# Patient Record
Sex: Male | Born: 1955 | Race: White | Hispanic: No | Marital: Single | Smoking: Never smoker
Health system: Southern US, Community
[De-identification: ages and names within clinical notes are randomized; demographics above are authoritative.]

## PROBLEM LIST (undated history)

## (undated) DIAGNOSIS — I513 Intracardiac thrombosis, not elsewhere classified: Secondary | ICD-10-CM

## (undated) DIAGNOSIS — B20 Human immunodeficiency virus [HIV] disease: Secondary | ICD-10-CM

## (undated) DIAGNOSIS — D0372 Melanoma in situ of left lower limb, including hip: Secondary | ICD-10-CM

## (undated) DIAGNOSIS — I5022 Chronic systolic (congestive) heart failure: Secondary | ICD-10-CM

---

## 2013-07-08 HISTORY — PX: VENTRICULOPERITONEAL SHUNT: SHX204

## 2014-01-19 ENCOUNTER — Ambulatory Visit: Payer: Self-pay

## 2014-02-28 ENCOUNTER — Ambulatory Visit: Payer: Self-pay

## 2017-03-03 DIAGNOSIS — I240 Acute coronary thrombosis not resulting in myocardial infarction: Secondary | ICD-10-CM

## 2017-03-03 DIAGNOSIS — I5022 Chronic systolic (congestive) heart failure: Secondary | ICD-10-CM

## 2017-03-03 HISTORY — DX: Acute coronary thrombosis not resulting in myocardial infarction: I24.0

## 2017-03-03 HISTORY — DX: Chronic systolic (congestive) heart failure: I50.22

## 2017-08-08 DIAGNOSIS — D0372 Melanoma in situ of left lower limb, including hip: Secondary | ICD-10-CM

## 2017-08-08 HISTORY — DX: Melanoma in situ of left lower limb, including hip: D03.72

## 2018-04-06 ENCOUNTER — Inpatient Hospital Stay: Payer: Medicare HMO

## 2018-04-06 ENCOUNTER — Inpatient Hospital Stay
Admission: EM | Admit: 2018-04-06 | Discharge: 2018-05-08 | DRG: 974 | Disposition: E | Payer: Medicare HMO | Attending: Internal Medicine | Admitting: Internal Medicine

## 2018-04-06 ENCOUNTER — Emergency Department: Payer: Medicare HMO

## 2018-04-06 ENCOUNTER — Encounter: Payer: Self-pay | Admitting: *Deleted

## 2018-04-06 DIAGNOSIS — I472 Ventricular tachycardia: Secondary | ICD-10-CM | POA: Diagnosis present

## 2018-04-06 DIAGNOSIS — B20 Human immunodeficiency virus [HIV] disease: Secondary | ICD-10-CM | POA: Diagnosis present

## 2018-04-06 DIAGNOSIS — E872 Acidosis: Secondary | ICD-10-CM | POA: Diagnosis present

## 2018-04-06 DIAGNOSIS — I429 Cardiomyopathy, unspecified: Secondary | ICD-10-CM | POA: Diagnosis present

## 2018-04-06 DIAGNOSIS — G253 Myoclonus: Secondary | ICD-10-CM | POA: Diagnosis present

## 2018-04-06 DIAGNOSIS — R6520 Severe sepsis without septic shock: Secondary | ICD-10-CM | POA: Diagnosis present

## 2018-04-06 DIAGNOSIS — B9561 Methicillin susceptible Staphylococcus aureus infection as the cause of diseases classified elsewhere: Secondary | ICD-10-CM | POA: Diagnosis present

## 2018-04-06 DIAGNOSIS — Z66 Do not resuscitate: Secondary | ICD-10-CM | POA: Diagnosis not present

## 2018-04-06 DIAGNOSIS — R570 Cardiogenic shock: Secondary | ICD-10-CM | POA: Diagnosis present

## 2018-04-06 DIAGNOSIS — E440 Moderate protein-calorie malnutrition: Secondary | ICD-10-CM

## 2018-04-06 DIAGNOSIS — Z6825 Body mass index (BMI) 25.0-25.9, adult: Secondary | ICD-10-CM | POA: Diagnosis not present

## 2018-04-06 DIAGNOSIS — F329 Major depressive disorder, single episode, unspecified: Secondary | ICD-10-CM | POA: Diagnosis present

## 2018-04-06 DIAGNOSIS — J690 Pneumonitis due to inhalation of food and vomit: Secondary | ICD-10-CM | POA: Diagnosis not present

## 2018-04-06 DIAGNOSIS — I272 Pulmonary hypertension, unspecified: Secondary | ICD-10-CM | POA: Diagnosis present

## 2018-04-06 DIAGNOSIS — J9602 Acute respiratory failure with hypercapnia: Secondary | ICD-10-CM | POA: Diagnosis present

## 2018-04-06 DIAGNOSIS — E875 Hyperkalemia: Secondary | ICD-10-CM | POA: Diagnosis not present

## 2018-04-06 DIAGNOSIS — J189 Pneumonia, unspecified organism: Secondary | ICD-10-CM

## 2018-04-06 DIAGNOSIS — A419 Sepsis, unspecified organism: Principal | ICD-10-CM | POA: Diagnosis present

## 2018-04-06 DIAGNOSIS — I469 Cardiac arrest, cause unspecified: Secondary | ICD-10-CM | POA: Diagnosis present

## 2018-04-06 DIAGNOSIS — J9811 Atelectasis: Secondary | ICD-10-CM | POA: Diagnosis not present

## 2018-04-06 DIAGNOSIS — I447 Left bundle-branch block, unspecified: Secondary | ICD-10-CM | POA: Diagnosis present

## 2018-04-06 DIAGNOSIS — R34 Anuria and oliguria: Secondary | ICD-10-CM | POA: Diagnosis present

## 2018-04-06 DIAGNOSIS — Z01818 Encounter for other preprocedural examination: Secondary | ICD-10-CM

## 2018-04-06 DIAGNOSIS — Z79899 Other long term (current) drug therapy: Secondary | ICD-10-CM

## 2018-04-06 DIAGNOSIS — N170 Acute kidney failure with tubular necrosis: Secondary | ICD-10-CM | POA: Diagnosis present

## 2018-04-06 DIAGNOSIS — R680 Hypothermia, not associated with low environmental temperature: Secondary | ICD-10-CM | POA: Diagnosis not present

## 2018-04-06 DIAGNOSIS — A4901 Methicillin susceptible Staphylococcus aureus infection, unspecified site: Secondary | ICD-10-CM | POA: Diagnosis present

## 2018-04-06 DIAGNOSIS — K7589 Other specified inflammatory liver diseases: Secondary | ICD-10-CM | POA: Diagnosis present

## 2018-04-06 DIAGNOSIS — K7200 Acute and subacute hepatic failure without coma: Secondary | ICD-10-CM | POA: Diagnosis present

## 2018-04-06 DIAGNOSIS — R569 Unspecified convulsions: Secondary | ICD-10-CM | POA: Diagnosis present

## 2018-04-06 DIAGNOSIS — I509 Heart failure, unspecified: Secondary | ICD-10-CM

## 2018-04-06 DIAGNOSIS — Z85820 Personal history of malignant melanoma of skin: Secondary | ICD-10-CM

## 2018-04-06 DIAGNOSIS — Z515 Encounter for palliative care: Secondary | ICD-10-CM | POA: Diagnosis not present

## 2018-04-06 DIAGNOSIS — I5022 Chronic systolic (congestive) heart failure: Secondary | ICD-10-CM | POA: Diagnosis present

## 2018-04-06 DIAGNOSIS — Z7901 Long term (current) use of anticoagulants: Secondary | ICD-10-CM

## 2018-04-06 DIAGNOSIS — F419 Anxiety disorder, unspecified: Secondary | ICD-10-CM | POA: Diagnosis present

## 2018-04-06 DIAGNOSIS — G931 Anoxic brain damage, not elsewhere classified: Secondary | ICD-10-CM | POA: Diagnosis present

## 2018-04-06 DIAGNOSIS — Z8661 Personal history of infections of the central nervous system: Secondary | ICD-10-CM

## 2018-04-06 DIAGNOSIS — D696 Thrombocytopenia, unspecified: Secondary | ICD-10-CM | POA: Diagnosis present

## 2018-04-06 DIAGNOSIS — I214 Non-ST elevation (NSTEMI) myocardial infarction: Secondary | ICD-10-CM | POA: Diagnosis present

## 2018-04-06 DIAGNOSIS — Z982 Presence of cerebrospinal fluid drainage device: Secondary | ICD-10-CM

## 2018-04-06 DIAGNOSIS — Z4659 Encounter for fitting and adjustment of other gastrointestinal appliance and device: Secondary | ICD-10-CM

## 2018-04-06 DIAGNOSIS — I513 Intracardiac thrombosis, not elsewhere classified: Secondary | ICD-10-CM | POA: Diagnosis present

## 2018-04-06 DIAGNOSIS — J9601 Acute respiratory failure with hypoxia: Secondary | ICD-10-CM | POA: Diagnosis present

## 2018-04-06 DIAGNOSIS — D649 Anemia, unspecified: Secondary | ICD-10-CM | POA: Diagnosis not present

## 2018-04-06 HISTORY — DX: Intracardiac thrombosis, not elsewhere classified: I51.3

## 2018-04-06 HISTORY — DX: Human immunodeficiency virus (HIV) disease: B20

## 2018-04-06 HISTORY — DX: Melanoma in situ of left lower limb, including hip: D03.72

## 2018-04-06 HISTORY — DX: Chronic systolic (congestive) heart failure: I50.22

## 2018-04-06 LAB — COMPREHENSIVE METABOLIC PANEL
ALBUMIN: 3.7 g/dL (ref 3.5–5.0)
ALK PHOS: 81 U/L (ref 38–126)
ALT: 944 U/L — ABNORMAL HIGH (ref 17–63)
ANION GAP: 24 — AB (ref 5–15)
AST: 972 U/L — ABNORMAL HIGH (ref 15–41)
BILIRUBIN TOTAL: 1.4 mg/dL — AB (ref 0.3–1.2)
BUN: 29 mg/dL — ABNORMAL HIGH (ref 6–20)
CO2: 15 mmol/L — ABNORMAL LOW (ref 22–32)
Calcium: 9 mg/dL (ref 8.9–10.3)
Chloride: 105 mmol/L (ref 101–111)
Creatinine, Ser: 2.71 mg/dL — ABNORMAL HIGH (ref 0.61–1.24)
GFR calc Af Amer: 28 mL/min — ABNORMAL LOW (ref >60)
GFR calc non Af Amer: 24 mL/min — ABNORMAL LOW (ref >60)
GLUCOSE: 89 mg/dL (ref 65–99)
Potassium: 5 mmol/L (ref 3.5–5.1)
Sodium: 144 mmol/L (ref 135–145)
Total Protein: 6.2 g/dL — ABNORMAL LOW (ref 6.5–8.1)

## 2018-04-06 LAB — APTT: aPTT: 39 seconds — ABNORMAL HIGH (ref 24–36)

## 2018-04-06 LAB — CBC WITH DIFFERENTIAL/PLATELET
Basophils Absolute: 0 10*3/uL (ref 0–0.1)
Basophils Relative: 0 %
EOS ABS: 0 10*3/uL (ref 0–0.7)
EOS PCT: 0 %
HCT: 44.1 % (ref 40.0–52.0)
Hemoglobin: 14.5 g/dL (ref 13.0–18.0)
LYMPHS ABS: 0.9 10*3/uL — AB (ref 1.0–3.6)
Lymphocytes Relative: 6 %
MCH: 35.2 pg — AB (ref 26.0–34.0)
MCHC: 33 g/dL (ref 32.0–36.0)
MCV: 106.7 fL — ABNORMAL HIGH (ref 80.0–100.0)
MONO ABS: 0.9 10*3/uL (ref 0.2–1.0)
Monocytes Relative: 6 %
Neutro Abs: 13.6 10*3/uL — ABNORMAL HIGH (ref 1.4–6.5)
Neutrophils Relative %: 88 %
Platelets: 143 10*3/uL — ABNORMAL LOW (ref 150–440)
RBC: 4.13 MIL/uL — ABNORMAL LOW (ref 4.40–5.90)
RDW: 13.4 % (ref 11.5–14.5)
WBC: 15.4 10*3/uL — ABNORMAL HIGH (ref 3.8–10.6)

## 2018-04-06 LAB — LIPID PANEL
Cholesterol: 199 mg/dL (ref 0–200)
HDL: 68 mg/dL (ref >40)
LDL CALC: 116 mg/dL — AB (ref 0–99)
TRIGLYCERIDES: 75 mg/dL (ref <150)
Total CHOL/HDL Ratio: 2.9 RATIO
VLDL: 15 mg/dL (ref 0–40)

## 2018-04-06 LAB — PROTIME-INR
INR: 1.79
PROTHROMBIN TIME: 20.6 s — AB (ref 11.4–15.2)

## 2018-04-06 LAB — TROPONIN I: Troponin I: 17.6 ng/mL (ref <0.03)

## 2018-04-06 MED ORDER — ETOMIDATE 2 MG/ML IV SOLN
INTRAVENOUS | Status: AC | PRN
  Administered 2018-04-06: 15 mg via INTRAVENOUS

## 2018-04-06 MED ORDER — CHLORHEXIDINE GLUCONATE 0.12% ORAL RINSE (MEDLINE KIT)
15.0000 mL | Freq: Two times a day (BID) | OROMUCOSAL | Status: DC
  Administered 2018-04-07 – 2018-04-12 (×12): 15 mL via OROMUCOSAL

## 2018-04-06 MED ORDER — ACETAMINOPHEN 650 MG RE SUPP: 650.0000 mg | Freq: Four times a day (QID) | RECTAL | Status: DC | PRN

## 2018-04-06 MED ORDER — SODIUM CHLORIDE 0.9 % IV SOLN
INTRAVENOUS | Status: DC
  Administered 2018-04-07: via INTRAVENOUS

## 2018-04-06 MED ORDER — PROPOFOL 1000 MG/100ML IV EMUL
5.0000 ug/kg/min | Freq: Once | INTRAVENOUS | Status: AC
  Administered 2018-04-06: 10 ug/kg/min via INTRAVENOUS

## 2018-04-06 MED ORDER — DOPAMINE-DEXTROSE 3.2-5 MG/ML-% IV SOLN
2.0000 ug/kg/min | Freq: Once | INTRAVENOUS | Status: AC
  Administered 2018-04-06: 10 ug/kg/min via INTRAVENOUS

## 2018-04-06 MED ORDER — HEPARIN SODIUM (PORCINE) 5000 UNIT/ML IJ SOLN
60.0000 [IU]/kg | Freq: Once | INTRAMUSCULAR | Status: AC
  Administered 2018-04-06: 3600 [IU] via INTRAVENOUS
  Filled 2018-04-06: qty 1

## 2018-04-06 MED ORDER — ASPIRIN 81 MG PO CHEW: 324.0000 mg | CHEWABLE_TABLET | Freq: Once | ORAL | Status: DC

## 2018-04-06 MED ORDER — PROPOFOL 1000 MG/100ML IV EMUL
INTRAVENOUS | Status: AC
  Filled 2018-04-06: qty 100

## 2018-04-06 MED ORDER — DOCUSATE SODIUM 100 MG PO CAPS
100.0000 mg | ORAL_CAPSULE | Freq: Two times a day (BID) | ORAL | Status: DC
  Filled 2018-04-06: qty 1

## 2018-04-06 MED ORDER — FAMOTIDINE IN NACL 20-0.9 MG/50ML-% IV SOLN
20.0000 mg | INTRAVENOUS | Status: DC
  Administered 2018-04-07 – 2018-04-09 (×3): 20 mg via INTRAVENOUS
  Filled 2018-04-06 (×3): qty 50

## 2018-04-06 MED ORDER — FENTANYL CITRATE (PF) 100 MCG/2ML IJ SOLN
50.0000 ug | Freq: Once | INTRAMUSCULAR | Status: AC
  Administered 2018-04-06: 50 ug via INTRAVENOUS
  Filled 2018-04-06: qty 2

## 2018-04-06 MED ORDER — NOREPINEPHRINE 4 MG/250ML-% IV SOLN
0.0000 ug/min | Freq: Once | INTRAVENOUS | Status: AC
  Administered 2018-04-06: 10 ug/min via INTRAVENOUS

## 2018-04-06 MED ORDER — NOREPINEPHRINE 4 MG/250ML-% IV SOLN
INTRAVENOUS | Status: AC
  Filled 2018-04-06: qty 250

## 2018-04-06 MED ORDER — ONDANSETRON HCL 4 MG/2ML IJ SOLN: 4.0000 mg | Freq: Four times a day (QID) | INTRAMUSCULAR | Status: DC | PRN

## 2018-04-06 MED ORDER — ORAL CARE MOUTH RINSE
15.0000 mL | OROMUCOSAL | Status: DC
  Administered 2018-04-07 – 2018-04-12 (×55): 15 mL via OROMUCOSAL

## 2018-04-06 MED ORDER — SUCCINYLCHOLINE CHLORIDE 20 MG/ML IJ SOLN
INTRAMUSCULAR | Status: AC | PRN
  Administered 2018-04-06: 100 mg via INTRAVENOUS

## 2018-04-06 MED ORDER — ONDANSETRON HCL 4 MG PO TABS: 4.0000 mg | ORAL_TABLET | Freq: Four times a day (QID) | ORAL | Status: DC | PRN

## 2018-04-06 MED ORDER — DOPAMINE-DEXTROSE 3.2-5 MG/ML-% IV SOLN
INTRAVENOUS | Status: AC
  Filled 2018-04-06: qty 250

## 2018-04-06 MED ORDER — ACETAMINOPHEN 325 MG PO TABS: 650.0000 mg | ORAL_TABLET | Freq: Four times a day (QID) | ORAL | Status: DC | PRN

## 2018-04-06 MED ORDER — HYDROCODONE-ACETAMINOPHEN 5-325 MG PO TABS: 1.0000 | ORAL_TABLET | ORAL | Status: DC | PRN

## 2018-04-06 MED ORDER — SODIUM CHLORIDE 0.9 % IV SOLN
INTRAVENOUS | Status: DC
  Administered 2018-04-06: 22:00:00 via INTRAVENOUS

## 2018-04-06 MED ORDER — SODIUM CHLORIDE 0.9 % IV BOLUS
1000.0000 mL | Freq: Once | INTRAVENOUS | Status: AC
  Administered 2018-04-06: 1000 mL via INTRAVENOUS

## 2018-04-06 MED ORDER — BISACODYL 5 MG PO TBEC: 5.0000 mg | DELAYED_RELEASE_TABLET | Freq: Every day | ORAL | Status: DC | PRN

## 2018-04-06 MED ORDER — HEPARIN SODIUM (PORCINE) 5000 UNIT/ML IJ SOLN: 5000.0000 [IU] | Freq: Three times a day (TID) | INTRAMUSCULAR | Status: DC

## 2018-04-06 NOTE — Progress Notes (Signed)
eLink Physician-Brief Progress Note Patient Name: Brian Boyle DOB: 10-Jul-1956 MRN: [id number]   Date of Service  04/01/2018  HPI/Events of Note  62 yo male with PMH of HIV and Cardiomyopathy. Went to gym c/o feeling light headed and weak. Found unresponsive. On EMS arrival --> no pulse in VT. Prolonged code estimated at 40 minutes. Now intubated and ventilated post ROSC. PCCM asked to admit. VSS. Head CT Scan pending. If Head CT without evidence of bleed, will plan on therapeutic hypothermia.    eICU Interventions  No new orders.      Intervention Category Evaluation Type: New Patient Evaluation  [name] 04/02/2018, 11:47 PM

## 2018-04-06 NOTE — ED Provider Notes (Signed)
[facility] Emergency Department Provider Note  Time seen: 9:50 PM  I have reviewed the triage vital signs and the nursing notes.   HISTORY  Chief Complaint Cardiac Arrest    HPI Brian Boyle is a 62 y.o. male with only known past medical history of being HIV who presents to the emergency department status post cardiac arrest.  According the EMS they were called out to the scene with an unresponsive patient found the patient to be in cardiac arrest initially with a rhythm that appeared to be ventricular tachycardia versus torsades.  Per EMS patient was given 2 g of magnesium, was given 2-3 rounds of epinephrine, bicarbonate and calcium, Narcan, was shocked multiple times, I believe 11 shocks were delivered prior to ER arrival.  They were able to regain a pulse initially very hypotensive however upon arrival and they state the last blood pressure they got was 100 systolic.  Patient currently 73/51.  Patient presents intubated, with spontaneous pulse.   Past Medical History:  Diagnosis Date  . HIV (human immunodeficiency virus infection) (HCC)     There are no active problems to display for this patient.   History reviewed. No pertinent surgical history.  Prior to Admission medications   Not on File    No Known Allergies  No family history on file.  Social History Social History   Tobacco Use  . Smoking status: Not on file  Substance Use Topics  . Alcohol use: Not on file  . Drug use: Not on file    Review of Systems Unable to obtain a review of systems secondary to cardiac arrest  ____________________________________________   PHYSICAL EXAM:  VITAL SIGNS: ED Triage Vitals  Enc Vitals Group     BP 04-22-18 2131 (!) 140/115     Pulse Rate Apr 22, 2018 2131 75     Resp Apr 22, 2018 2131 20     Temp --      Temp src --      SpO2 2018-04-22 2125 99 %     Weight 04-22-18 2133 132 lb 4.4 oz (60 kg)     Height --      Head Circumference --    Peak Flow --      Pain Score 2018-04-22 2132 0     Pain Loc --      Pain Edu? --      Excl. in GC? --     Constitutional: Unresponsive occasional twitching/movement of extremities Eyes: 3 to 4 mm, sluggishly reactive bilaterally. ENT   Head: Normocephalic and atraumatic.   Mouth/Throat: Blood within the oral cavity.  ET tube present. Cardiovascular: Regular rhythm, rate around 80 bpm.  No obvious murmur Respiratory: Patient intubated, appears to have breath sounds bilaterally although significantly diminished with minimal chest rise.  No obvious sounds over epigastrium.  Significant abrasions to chest consistent with CPR/Lucas device. Gastrointestinal: Soft thin, no trauma present Musculoskeletal: Thin extremities.  No obvious trauma. Neurologic: Unresponsive.  No response to painful stimuli.  Occasionally moves extremities Skin:  Skin is warm, dry Psychiatric: Unresponsive  ____________________________________________    EKG  EKG reviewed and interpreted by myself shows sinus rhythm at 65 bpm with a widened QRS, normal axis, prolonged QTC patient has morphology consistent with left bundle branch block no old EKG for comparison available.  ____________________________________________    RADIOLOGY  ET tube 6 cm above carina.  Esophageal tube overlying distal esophagus recommend further advancement.  Right IJ central catheter tip above SVC.  No pneumothorax.  ____________________________________________   INITIAL IMPRESSION / ASSESSMENT AND PLAN / ED COURSE  Pertinent labs & imaging results that were available during my care of the patient were reviewed by me and considered in my medical decision making (see chart for details).  She presents to the emergency department status post cardiac arrest with spontaneous pulse intubated.  Diminished breath sounds with minimal chest rise.  I used a glide scope to evaluate ET tube placement, appear to be consistent with ET tube  dislodgment.  ET tube was pulled and the patient was bagged with a bag-valve-mask.  Jaw was clenched, given succinylcholine as well as 15 mg of etomidate.  Relaxation of muscles was achieved and the patient was intubated with a glide scope without issue.  Good chest rise with equal breath sounds bilaterally.  Patient was found to continue to be hypotensive 70s, I placed a right IJ central line with ultrasound guidance started the patient on Levophed.  Patient was biting the tube at times started on propofol, will occasionally move extremities more of a twitching movement.  It is not clear at this time if this is purposeful moving versus posturing.  I discussed the patient with the intensivist, labs are pending at this time.  Discussed patient with the hospitalist will be admitting the patient.  I discussed the patient with family mom states the patient became acutely unresponsive and appeared to be frothing at the mouth, this could indicate possible pulmonary edema or ACS.  Patient's labs have resulted with a significantly elevated troponin of 17.  Remainder of the labs are still pending.  Will discuss with cardiology as well.  Discussed the patient Dr. Johney Frame of cardiology, recommends ICU admission continued monitoring, will decide upon catheterization once the patient is stabilized in ICU.  INTUBATION Performed by: Minna Antis  Required items: required blood products, implants, devices, and special equipment available Patient identity confirmed: provided demographic data and hospital-assigned identification number Time out: Immediately prior to procedure a "time out" was called to verify the correct patient, procedure, equipment, support staff and site/side marked as required.  Indications: cardiac arrest  Intubation method: 4.0 Glidescope Laryngoscopy   Preoxygenation: 100% BVM  Sedatives:  Etomidate Paralytic:  Succinylcholine  Tube Size: 7.5 cuffed  Post-procedure  assessment: chest rise and ETCO2 monitor Breath sounds: equal and absent over the epigastrium Tube secured with: ETT holder Chest x-ray interpreted by radiologist and me.  Chest x-ray findings: endotracheal tube above carina  Patient tolerated the procedure well with no immediate complications.  CENTRAL LINE Performed by: Minna Antis Consent: The procedure was performed in an emergent situation. Required items: required blood products, implants, devices, and special equipment available Patient identity confirmed: arm band and provided demographic data Time out: Immediately prior to procedure a "time out" was called to verify the correct patient, procedure, equipment, support staff and site/side marked as required. Indications: vascular access Anesthesia: local infiltration Local anesthetic: lidocaine 1% with epinephrine Anesthetic total: 3 ml Patient sedated: no Preparation: skin prepped with 2% chlorhexidine Skin prep agent dried: skin prep agent completely dried prior to procedure Sterile barriers: all five maximum sterile barriers used - cap, mask, sterile gown, sterile gloves, and large sterile sheet Hand hygiene: hand hygiene performed prior to central venous catheter insertion  Location details: Right IJ  Catheter type: triple lumen Catheter size: 8 Fr Pre-procedure: landmarks identified Ultrasound guidance: Yes Successful placement: yes Post-procedure: line sutured and dressing applied Assessment: blood return through all parts, free fluid flow, placement verified by x-ray  and no pneumothorax on x-ray Patient tolerance: Patient tolerated the procedure well with no immediate complications.    CRITICAL CARE Performed by: Minna Antis   Total critical care time: 45 minutes  Critical care time was exclusive of separately billable procedures and treating other patients.  Critical care was necessary to treat or prevent imminent or life-threatening  deterioration.  Critical care was time spent personally by me on the following activities: development of treatment plan with patient and/or surrogate as well as nursing, discussions with consultants, evaluation of patient's response to treatment, examination of patient, obtaining history from patient or surrogate, ordering and performing treatments and interventions, ordering and review of laboratory studies, ordering and review of radiographic studies, pulse oximetry and re-evaluation of patient's condition.    ____________________________________________   FINAL CLINICAL IMPRESSION(S) / ED DIAGNOSES  Status post cardiac arrest    Minna Antis, MD 03/22/2018 2300

## 2018-04-06 NOTE — ED Triage Notes (Signed)
Pt had witnessed arrest PTA, this was witnessed by his mother.  Pt had an approximately 4 minute downtime before EMS arrived and began CPR.  Pt was found to be in torsades and then in V-tach.  Pt had a IO placed in R tibia and a 20g IV in L EJ by ems PTA.  Pt was shocked a total of 11 times PTA and had the following medications: 3 amps of EPI, 2 magnesium, 2 amps of bicarb, 2 amps of calcium,  Narcan, amiodarone drip, NS.  Pt total downtime per ems was until they achieved ROSC with sinus tach initially and then NSR.  Per ems pt had some purposeful movement after ROSC and on arrival to ED was clamping down on ETT and required sedation.

## 2018-04-06 NOTE — ED Notes (Signed)
Pt is having central line placed by Dr. Lenard Lance

## 2018-04-06 NOTE — ED Notes (Signed)
RT has been at the bedside since ABG was ordered.  Pt continues to have posturing, EDP aware.  Pt has response in pupils.  Increased BP medication due to continued Hypotension.

## 2018-04-06 NOTE — ED Notes (Signed)
Date and time results received: May 06, 2018 10:38 PM Test: Troponin I Critical Value: 17.0  Name of Provider Notified: Paduchowski   Orders Received? Or Actions Taken?:

## 2018-04-06 NOTE — Consult Note (Addendum)
PULMONARY / CRITICAL CARE MEDICINE   Name: Brian Boyle MRN: [id number] DOB: May 26, 1956    ADMISSION DATE:  03/29/2018 CONSULTATION DATE: 03/17/2018  REFERRING MD: Dr. Duane Boston   CHIEF COMPLAINT: Cardiac Arrest   HISTORY OF PRESENT ILLNESS:   This is a 62 yo male with a PMH of HIV, Chronic Systolic CHF, Non-sustained Ventricular Tachycardia, Cardiomyopathy, Left Ventricular Mural Thrombus without MI, Anxiety, Depressive Disorder, Cryptococcal Meningoencephalitis, and Melanoma.  He presented to [facility] ER via EMS on 04/30 s/p witnessed cardiac arrest.  Per ER notes pt c/o lightheadedness and weakness then proceeded to the gym.  He returned home and became unresponsive foaming at the mouth, therefore pts mother notified EMS. EMS reported upon their arrival the pt was unresponsive in cardiac arrest initial cardiac rhythm ventricular tachycardia vs. torsades. EMS administered 2g of magnesium, 2-3 rounds of epinephrine, sodium bicarbonate, calcium, narcan, and pt required defibrillation multiple times (estimated 11 times) with ROSC estimated total downtime 40 minutes.  Following ROSC pt hypotensive and mechanically intubated in the field.  In the ER ET tube found to be dislodged, therefore pt required reintubation by ER physician. Per ER notes pt occasionally moving extremities, however uncertain if movement purposeful or posturing.  CXR negative and lab results revealed creatinine 2.71, BUN 29, anion gap 24, AST 972, ALT 944, total bilirubin 1.4, troponin 17.60, wbc 15.4, and platelets 143.  ER physician spoke with Cardiologist Dr. Rayann Heman regarding elevated troponin who recommended ICU admission for continued monitoring and stabilization for now.  He was subsequently admitted to ICU by hospitalist team for further workup and treatment PCCM consulted for vent management.  PAST MEDICAL HISTORY :  He  has a past medical history of HIV (human immunodeficiency virus infection) ([place]).  PAST SURGICAL HISTORY: He   has no past surgical history on file.  No Known Allergies  No current facility-administered medications on file prior to encounter.    No current outpatient medications on file prior to encounter.    FAMILY HISTORY:  His has no family status information on file.    SOCIAL HISTORY: He    REVIEW OF SYSTEMS:   Unable to assess pt intubated   SUBJECTIVE:  Unable to assess pt intubated   VITAL SIGNS: BP (!) 84/46   Pulse 69   Temp 97.6 F (36.4 C) (Rectal)   Resp (!) 23   Wt 60 kg (132 lb 4.4 oz)   SpO2 95%   HEMODYNAMICS:    VENTILATOR SETTINGS: Vent Mode: PRVC FiO2 (%):  [100 %] 100 % Set Rate:  [16 bmp] 16 bmp Vt Set:  [500 mL] 500 mL PEEP:  [5 cmH20] 5 cmH20  INTAKE / OUTPUT: No intake/output data recorded.  PHYSICAL EXAMINATION: General: acutely ill appearing male, NAD mechanically intubated  Neuro: unresponsive, left pupil dilated 4 mm reactive, right pupil 3 mm reactive, decorticate posturing  HEENT: supple, no JVD  Cardiovascular: sinus rhythm, rrr, no R/G Lungs: diminished with rhonchi throughout, even, non labored  Abdomen: +BS x4, soft, non distended  Musculoskeletal: normal bulk and tone, no edema  Skin: bilateral lower extremities cyanotic, erythema present center of chest   LABS:  BMET Recent Labs  Lab 03/08/2018 2146  NA 144  K 5.0  CL 105  CO2 15*  BUN 29*  CREATININE 2.71*  GLUCOSE 89    Electrolytes Recent Labs  Lab 03/27/2018 2146  CALCIUM 9.0    CBC Recent Labs  Lab 03/20/2018 2146  WBC 15.4*  HGB 14.5  HCT 44.1  PLT 143*    Coag's Recent Labs  Lab 04/05/2018 2146  APTT 39*  INR 1.79    Sepsis Markers No results for input(s): LATICACIDVEN, PROCALCITON, O2SATVEN in the last 168 hours.  ABG No results for input(s): PHART, PCO2ART, PO2ART in the last 168 hours.  Liver Enzymes Recent Labs  Lab 04/05/2018 2146  AST 972*  ALT 944*  ALKPHOS 81  BILITOT 1.4*  ALBUMIN 3.7    Cardiac Enzymes Recent Labs  Lab  03/12/2018 2146  TROPONINI 17.60*    Glucose No results for input(s): GLUCAP in the last 168 hours.  Imaging Dg Chest Portable 1 View  Result Date: 03/16/2018 CLINICAL DATA:  Post code EXAM: PORTABLE CHEST 1 VIEW COMPARISON:  None. FINDINGS: Endotracheal tube tip is about 6.1 cm superior to carina. Esophageal tube tip projects over the distal esophagus. Right IJ central venous catheter tip over the SVC. No pneumothorax. No focal opacity or pleural effusion. Mild cardiomegaly. Probable left-sided shunt catheter. IMPRESSION: 1. Endotracheal tube tip about 6.1 cm superior to carina 2. Esophageal tube tip overlies the distal esophagus, further advancement recommended for more optimal positioning. 3. Mild cardiomegaly without acute infiltrate 4. Right IJ central venous catheter tip over the SVC without pneumothorax. Electronically Signed   By: Donavan Foil M.D.   On: 04/05/2018 22:23   Dg Abd Portable 1 View  Result Date: 04/05/2018 CLINICAL DATA:  OG tube placement EXAM: PORTABLE ABDOMEN - 1 VIEW COMPARISON:  None. FINDINGS: The tip of the esophageal tube may be visible near the distal esophagus. Mild gaseous enlargement of stomach. Left-sided shunt tubing courses toward the pelvis but is incompletely imaged. Nonobstructed gas pattern. IMPRESSION: Esophageal tube tip appears to overlie the distal esophagus. Further advancement recommended for more optimal positioning Electronically Signed   By: Donavan Foil M.D.   On: 03/20/2018 22:24   STUDIES:  CT Head 04/30>>No definite CT evidence for acute intracranial abnormality. Left frontal ventricular catheter with adjacent encephalomalacia in the frontal lobe. Slight asymmetric appearance of ventricles, right larger than left but without hydrocephalus  CULTURES: Urine 04/30>> Blood 04/30>> Respiratory 04/30>>  ANTIBIOTICS: None   SIGNIFICANT EVENTS: 04/30-Pt admitted to ICU s/p cardiac arrest mechanically intubated and hypothermic protocol  initiated at 36 degrees C  LINES/TUBES: ETT 04/30>> Right IJ CVL 04/30>>  ASSESSMENT / PLAN:  PULMONARY A: Acute respiratory failure s/p cardiac arrest  Mechanical Intubation  P:   Full vent support-vent setting reviewed and established  SBT once all parameters met  VAP bundle implemented Repeat CXR and ABG in am Hypothermic protocol _0  degrees Celsius   CARDIOVASCULAR A:  Cardiac arrest-vtach vs. torsades  Elevated troponin s/p cardiac arrest Hypotension likely cardiogenic will r/o sepsis  Hx: Chronic Systolic CHF, Non-sustained Ventricular Tachycardia, Cardiomyopathy, Left Ventricular Mural Thrombus without MI P:  Trend troponin's  Cardiology consulted appreciate input Echo pending  Prn levophed, dopamine, and vasopressin gtts to maintain map >65  RENAL A:   Acute renal failure  Metabolic Acidosis  P:   Sodium Bicarb gtt _1  ml/hr  CVP q4hrs  Trend BMP Replace electrolytes as indicated  Monitor UOP  GASTROINTESTINAL A:   Transaminitis  P:   Trend liver enzymes  Keep NPO for now  SUP px: iv pepcid   HEMATOLOGIC A:   Thrombocytopenia  P:  VTE px: heparin gtt if platelet count continues to decrease will discontinue  Trend CBC Monitor for s/sx of bleeding and transfuse for hgb <8  INFECTIOUS A:   Leukocytosis  Hx: HIV  P:   Trend WBC and monitor fever curve  Trend PCT and lactic acid Follow cultures   ENDOCRINE A:   No acute issues  P:   CBG's q4hrs if >180 will start SSI   NEUROLOGIC A:   Acute encephalopathy concerning for possible anoxic brain injury  Myoclonic Jerking vs. Seizure Activity  Mechanical Ventilation Discomfort/Pain  P:   RASS goal: -1 Versed and fentanyl gtts to maintain RASS goal  WUA once hypothermic protocol completed EEG and Urine Drug Dcreen pending  Neurology consulted appreciate input IV Keppra  Spoke with and collaborated with Dr. Signa Kell Intensivist regarding plan of care as listed above  FAMILY  -  Updates: No family at bedside to update at this time 04/07/2018  Marda Stalker, [place] Pager [phone] (please enter 7 digits) PCCM Consult Pager [phone] (please enter 7 digits)   04/07/2018  08:35 Off neosynephrine On Versed + Fentanyl Oliguric on Bicarb. Gtt. Mottling of both feet. Palpable DP and PT bil. Unresponsive Trop is trending up La tic acid trending down  -Monitor renal panel, ABG, urine out put -Taper off pressers as tolerated  -On induced hypothermia   04/07/2018 Patient's mother was updated at the bedside, code status was readdressed and she decided to change code status to DNR, continue with current measures, no CPR and no defitillation.

## 2018-04-06 NOTE — ED Notes (Signed)
Pt is being taken to CT and then to ICU with RN and RT and additional staff member

## 2018-04-06 NOTE — H&P (Signed)
[facility] - [place] at [facility]   PATIENT NAME: Brian Boyle    MR#:  [id number]  DATE OF BIRTH:  1956-03-04  DATE OF ADMISSION:  05-05-18  PRIMARY CARE PHYSICIAN: Patient, No Pcp Per   REQUESTING/REFERRING PHYSICIAN:   CHIEF COMPLAINT:   Chief Complaint  Patient presents with  . Cardiac Arrest    HISTORY OF PRESENT ILLNESS: Brian Boyle  is a 62 y.o. male with a known history of HIV, chronic systolic CHF, nonsustained ventricular tachycardia, cardiomyopathy, left ventricular mural thrombus, anxiety/depression disorder, cryptococcal meningoencephalitis and melanoma. Patient is intubated, unresponsive, not able to provide history.  Most information was taken from reviewing the medical records and from discussion with emergency room physician. He was brought to emergency room via EMS, status post witnessed cardiac arrest.  Apparently, patient collapsed at home, after he returned from the gym, earlier in the day.  His mother called 911 right away.  Per paramedics report, patient was unresponsive, and cardiac arrest and initial cardiac rhythm showed ventricular tachycardia versus torsades.  He was coded for approximately 40 minutes.  He was defibrillated 11 times.  And was intubated and started on pressor support and brought to emergency room.  Blood test done emergency room showed creatinine elevated at 2.71, anion gap 24, AST 972, ALT 944.  Ammonia level is 17.6.  WBC is 15.4 toilet count is 143.  Patient is admitted to intensive care unit for further management.  PAST MEDICAL HISTORY:   Past Medical History:  Diagnosis Date  . HIV (human immunodeficiency virus infection) (HCC)     PAST SURGICAL HISTORY: History reviewed. No pertinent surgical history.  SOCIAL HISTORY: Unable to obtain, due to patient being unresponsive. Social History   Tobacco Use  . Smoking status: Not on file  Substance Use Topics  . Alcohol use: Not on file    FAMILY  HISTORY: Unable to obtain due to patient being unresponsive.  DRUG ALLERGIES: No Known Allergies  REVIEW OF SYSTEMS:   Unable to obtain, due to patient being intubated, nonverbal.  MEDICATIONS AT HOME: Unable to obtain, due to patient being unresponsive. Prior to Admission medications   Not on File      PHYSICAL EXAMINATION:   VITAL SIGNS: Blood pressure (!) 84/46, pulse 69, temperature 97.6 F (36.4 C), temperature source Rectal, resp. rate (!) 23, weight 60 kg (132 lb 4.4 oz), SpO2 95 %.  GENERAL:  62 y.o.-year-old patient lying in the bed, on vent support and sedated on propofol.  EYES: Left pupil is dilated at 4 mm reactive; right pupil is 3 mm reactive.  No scleral icterus.  HEENT: Head atraumatic, normocephalic.  Intubated. NECK:  Supple, no jugular venous distention. No thyroid enlargement, no tenderness.  LUNGS: Reduced breath sounds and rhonchi are heard bilaterally. No use of accessory muscles of respiration.  CARDIOVASCULAR: S1, S2 normal. No S3/S4.  ABDOMEN: Soft, nontender, nondistended. Bowel sounds present. No organomegaly or mass.  EXTREMITIES: No pedal edema.  Bilateral lower extremities are cyanotic. NEUROLOGIC: Unresponsive.  Decorticate posturing noted. PSYCHIATRIC: Patient is unresponsive. SKIN: There is erythema noted at the center of the chest, status post repeated defibrillation.  LABORATORY PANEL:   CBC Recent Labs  Lab 05-May-2018 2146  WBC 15.4*  HGB 14.5  HCT 44.1  PLT 143*  MCV 106.7*  MCH 35.2*  MCHC 33.0  RDW 13.4  LYMPHSABS 0.9*  MONOABS 0.9  EOSABS 0.0  BASOSABS 0.0   ------------------------------------------------------------------------------------------------------------------  Chemistries  No results for input(s): NA,  K, CL, CO2, GLUCOSE, BUN, CREATININE, CALCIUM, MG, AST, ALT, ALKPHOS, BILITOT in the last 168 hours.  Invalid input(s):  GFRCGP ------------------------------------------------------------------------------------------------------------------ CrCl cannot be calculated (No order found.). ------------------------------------------------------------------------------------------------------------------ No results for input(s): TSH, T4TOTAL, T3FREE, THYROIDAB in the last 72 hours.  Invalid input(s): FREET3   Coagulation profile Recent Labs  Lab 03/29/2018 2146  INR 1.79   ------------------------------------------------------------------------------------------------------------------- No results for input(s): DDIMER in the last 72 hours. -------------------------------------------------------------------------------------------------------------------  Cardiac Enzymes No results for input(s): CKMB, TROPONINI, MYOGLOBIN in the last 168 hours.  Invalid input(s): CK ------------------------------------------------------------------------------------------------------------------ Invalid input(s): POCBNP  ---------------------------------------------------------------------------------------------------------------  Urinalysis No results found for: COLORURINE, APPEARANCEUR, LABSPEC, PHURINE, GLUCOSEU, HGBUR, BILIRUBINUR, KETONESUR, PROTEINUR, UROBILINOGEN, NITRITE, LEUKOCYTESUR   RADIOLOGY: Dg Chest Portable 1 View  Result Date: 03/21/2018 CLINICAL DATA:  Post code EXAM: PORTABLE CHEST 1 VIEW COMPARISON:  None. FINDINGS: Endotracheal tube tip is about 6.1 cm superior to carina. Esophageal tube tip projects over the distal esophagus. Right IJ central venous catheter tip over the SVC. No pneumothorax. No focal opacity or pleural effusion. Mild cardiomegaly. Probable left-sided shunt catheter. IMPRESSION: 1. Endotracheal tube tip about 6.1 cm superior to carina 2. Esophageal tube tip overlies the distal esophagus, further advancement recommended for more optimal positioning. 3. Mild cardiomegaly without acute  infiltrate 4. Right IJ central venous catheter tip over the SVC without pneumothorax. Electronically Signed   By: Jasmine Pang M.D.   On: 03/29/2018 22:23   Dg Abd Portable 1 View  Result Date: 03/31/2018 CLINICAL DATA:  OG tube placement EXAM: PORTABLE ABDOMEN - 1 VIEW COMPARISON:  None. FINDINGS: The tip of the esophageal tube may be visible near the distal esophagus. Mild gaseous enlargement of stomach. Left-sided shunt tubing courses toward the pelvis but is incompletely imaged. Nonobstructed gas pattern. IMPRESSION: Esophageal tube tip appears to overlie the distal esophagus. Further advancement recommended for more optimal positioning Electronically Signed   By: Jasmine Pang M.D.   On: 03/29/2018 22:24    EKG: No orders found for this or any previous visit.  IMPRESSION AND PLAN:  1.  Acute respiratory failure, status post cardiac arrest.  Continue mechanical intubation and intensive care unit.  2.  Cardiac arrest, possibly secondary to V. Tach episode.  Continue to monitor on telemetry.  Cardiology is consulted for further evaluation and treatment. 3.  Cardiogenic shock.  Continue vent support and pressor support.  4.  Elevated troponin levels, that is post cardiac arrest.  We will continue to monitor troponin levels.  Cardiology is consulted for further recommendations. 5.  Acute metabolic acidosis, status post cardiac arrest and acute respiratory failure.  On bicarb drip.  Continue to monitor closely in intensive care unit. 6.  Acute renal failure, likely ATN, secondary to hypotension.  We will continue IV fluids and monitor kidney function closely.  Avoid nephrotoxic medications. 7.  Acute encephalopathy, status post cardiac arrest and acute respiratory failure.  There is high likelihood for anoxic brain injury.  Neurology is consulted for further evaluation and treatment.  We will continue supportive measures and monitor clinically closely. 8.  HIV, any maintenance therapy. 9.   Elevated liver function test, likely reactive to the acute illness.  We will continue to monitor, as we treat the underlying disease.  All the records are reviewed and case discussed with ED and ICU provider.   CODE STATUS: FULL    TOTAL TIME TAKING CARE OF THIS PATIENT: 50 minutes.    Cammy Copa M.D on 03/09/2018 at 10:47 PM  Between 7am to  6pm - Pager - [phone]  After 6pm go to [email or web address] - password [email or web address]  [facility]  Office  [phone]  CC: Primary care physician; Patient, No Pcp Per

## 2018-04-06 NOTE — ED Notes (Signed)
Date and time results received: 04/17/2018 10:41 PM  Test: BP and Sedation Critical Value: notified Dr. Lenard Lance that pt continues to have posturing, biting tube and I had to increase sedation and hypotension with Levophed at 30 at this time.    Name of Provider Notified:Dr. Paduchowski  Orders Received? Or Actions Taken?: Fentanyl IVPx1  for pain and add dopamine

## 2018-04-06 NOTE — Progress Notes (Signed)
ETT advanced to 26 cm mark  Tolerated well.

## 2018-04-07 ENCOUNTER — Other Ambulatory Visit: Payer: Self-pay

## 2018-04-07 ENCOUNTER — Inpatient Hospital Stay: Payer: Medicare HMO

## 2018-04-07 ENCOUNTER — Inpatient Hospital Stay (HOSPITAL_COMMUNITY): Payer: Medicare HMO

## 2018-04-07 ENCOUNTER — Inpatient Hospital Stay
Admit: 2018-04-07 | Discharge: 2018-04-07 | Disposition: A | Discharge status: Home / Self Care | Payer: Medicare HMO | Attending: Critical Care Medicine | Admitting: Critical Care Medicine

## 2018-04-07 DIAGNOSIS — G253 Myoclonus: Secondary | ICD-10-CM

## 2018-04-07 DIAGNOSIS — I469 Cardiac arrest, cause unspecified: Secondary | ICD-10-CM

## 2018-04-07 DIAGNOSIS — E440 Moderate protein-calorie malnutrition: Secondary | ICD-10-CM

## 2018-04-07 LAB — URINALYSIS, ROUTINE W REFLEX MICROSCOPIC
BILIRUBIN URINE: NEGATIVE
Glucose, UA: NEGATIVE mg/dL
Ketones, ur: NEGATIVE mg/dL
Leukocytes, UA: NEGATIVE
NITRITE: NEGATIVE
PH: 6 (ref 5.0–8.0)
Protein, ur: 100 mg/dL — AB
RBC / HPF: >50 RBC/hpf — ABNORMAL HIGH (ref 0–5)
SPECIFIC GRAVITY, URINE: 1.016 (ref 1.005–1.030)
Squamous Epithelial / LPF: NONE SEEN (ref 0–5)

## 2018-04-07 LAB — URINE DRUG SCREEN, QUALITATIVE (ARMC ONLY)
Amphetamines, Ur Screen: NOT DETECTED
Barbiturates, Ur Screen: NOT DETECTED
Benzodiazepine, Ur Scrn: NOT DETECTED
CANNABINOID 50 NG, UR ~~LOC~~: NOT DETECTED
COCAINE METABOLITE, UR ~~LOC~~: NOT DETECTED
MDMA (ECSTASY) UR SCREEN: NOT DETECTED
Methadone Scn, Ur: NOT DETECTED
Opiate, Ur Screen: NOT DETECTED
Phencyclidine (PCP) Ur S: NOT DETECTED
TRICYCLIC, UR SCREEN: NOT DETECTED

## 2018-04-07 LAB — BLOOD GAS, ARTERIAL
ACID-BASE DEFICIT: 13.3 mmol/L — AB (ref 0.0–2.0)
ACID-BASE EXCESS: 2.3 mmol/L — AB (ref 0.0–2.0)
Acid-base deficit: 8.3 mmol/L — ABNORMAL HIGH (ref 0.0–2.0)
BICARBONATE: 14.1 mmol/L — AB (ref 20.0–28.0)
BICARBONATE: 19.3 mmol/L — AB (ref 20.0–28.0)
Bicarbonate: 29.3 mmol/L — ABNORMAL HIGH (ref 20.0–28.0)
FIO2: 50
FIO2: 0.7
FIO2: 0.8
LHR: 20 {breaths}/min
MECHVT: 500 mL
MECHVT: 500 mL
MECHVT: 500 mL
Mechanical Rate: 20
O2 SAT: 83.7 %
O2 SAT: 92.3 %
O2 Saturation: 86.4 %
PATIENT TEMPERATURE: 37
PATIENT TEMPERATURE: 37
PCO2 ART: 37 mmHg (ref 32.0–48.0)
PCO2 ART: 53 mmHg — AB (ref 32.0–48.0)
PEEP/CPAP: 5 cmH2O
PEEP: 5 cmH2O
PEEP: 5 cmH2O
PH ART: 7.19 — AB (ref 7.350–7.450)
PH ART: 7.35 (ref 7.350–7.450)
PO2 ART: 65 mmHg — AB (ref 83.0–108.0)
PO2 ART: 58 mmHg — AB (ref 83.0–108.0)
PO2 ART: 68 mmHg — AB (ref 83.0–108.0)
Patient temperature: 37
RATE: 20 resp/min
pCO2 arterial: 46 mmHg (ref 32.0–48.0)
pH, Arterial: 7.23 — ABNORMAL LOW (ref 7.350–7.450)

## 2018-04-07 LAB — GLUCOSE, CAPILLARY
GLUCOSE-CAPILLARY: 76 mg/dL (ref 65–99)
Glucose-Capillary: 118 mg/dL — ABNORMAL HIGH (ref 65–99)
Glucose-Capillary: 175 mg/dL — ABNORMAL HIGH (ref 65–99)
Glucose-Capillary: 191 mg/dL — ABNORMAL HIGH (ref 65–99)
Glucose-Capillary: 220 mg/dL — ABNORMAL HIGH (ref 65–99)
Glucose-Capillary: 53 mg/dL — ABNORMAL LOW (ref 65–99)
Glucose-Capillary: 91 mg/dL (ref 65–99)
Glucose-Capillary: 98 mg/dL (ref 65–99)

## 2018-04-07 LAB — CBC
HCT: 44.9 % (ref 40.0–52.0)
HEMOGLOBIN: 15.3 g/dL (ref 13.0–18.0)
MCH: 36.4 pg — ABNORMAL HIGH (ref 26.0–34.0)
MCHC: 34.1 g/dL (ref 32.0–36.0)
MCV: 106.6 fL — ABNORMAL HIGH (ref 80.0–100.0)
Platelets: 117 10*3/uL — ABNORMAL LOW (ref 150–440)
RBC: 4.21 MIL/uL — AB (ref 4.40–5.90)
RDW: 13.6 % (ref 11.5–14.5)
WBC: 21.5 10*3/uL — ABNORMAL HIGH (ref 3.8–10.6)

## 2018-04-07 LAB — PROCALCITONIN
Procalcitonin: 1.78 ng/mL
Procalcitonin: 2.47 ng/mL

## 2018-04-07 LAB — TROPONIN I
Troponin I: >65 ng/mL (ref <0.03)
Troponin I: >65 ng/mL (ref <0.03)

## 2018-04-07 LAB — MAGNESIUM
MAGNESIUM: 2.5 mg/dL — AB (ref 1.7–2.4)
MAGNESIUM: 3.1 mg/dL — AB (ref 1.7–2.4)
Magnesium: 2.1 mg/dL (ref 1.7–2.4)

## 2018-04-07 LAB — BASIC METABOLIC PANEL
ANION GAP: 21 — AB (ref 5–15)
ANION GAP: 20 — AB (ref 5–15)
ANION GAP: 16 — AB (ref 5–15)
BUN: 31 mg/dL — ABNORMAL HIGH (ref 6–20)
BUN: 37 mg/dL — AB (ref 6–20)
BUN: 39 mg/dL — AB (ref 6–20)
CALCIUM: 7.2 mg/dL — AB (ref 8.9–10.3)
CALCIUM: 6.8 mg/dL — AB (ref 8.9–10.3)
CALCIUM: 6.9 mg/dL — AB (ref 8.9–10.3)
CO2: 16 mmol/L — ABNORMAL LOW (ref 22–32)
CO2: 19 mmol/L — ABNORMAL LOW (ref 22–32)
CO2: 26 mmol/L (ref 22–32)
Chloride: 110 mmol/L (ref 101–111)
Chloride: 104 mmol/L (ref 101–111)
Chloride: 102 mmol/L (ref 101–111)
Creatinine, Ser: 2.63 mg/dL — ABNORMAL HIGH (ref 0.61–1.24)
Creatinine, Ser: 3.11 mg/dL — ABNORMAL HIGH (ref 0.61–1.24)
Creatinine, Ser: 3.18 mg/dL — ABNORMAL HIGH (ref 0.61–1.24)
GFR calc Af Amer: 29 mL/min — ABNORMAL LOW (ref >60)
GFR calc Af Amer: 24 mL/min — ABNORMAL LOW (ref >60)
GFR calc Af Amer: 23 mL/min — ABNORMAL LOW (ref >60)
GFR, EST NON AFRICAN AMERICAN: 25 mL/min — AB (ref >60)
GFR, EST NON AFRICAN AMERICAN: 20 mL/min — AB (ref >60)
GFR, EST NON AFRICAN AMERICAN: 20 mL/min — AB (ref >60)
GLUCOSE: 186 mg/dL — AB (ref 65–99)
GLUCOSE: 257 mg/dL — AB (ref 65–99)
GLUCOSE: 112 mg/dL — AB (ref 65–99)
POTASSIUM: 4.2 mmol/L (ref 3.5–5.1)
Potassium: 4.3 mmol/L (ref 3.5–5.1)
Potassium: 5 mmol/L (ref 3.5–5.1)
SODIUM: 143 mmol/L (ref 135–145)
SODIUM: 144 mmol/L (ref 135–145)
Sodium: 147 mmol/L — ABNORMAL HIGH (ref 135–145)

## 2018-04-07 LAB — PHOSPHORUS
Phosphorus: 6.5 mg/dL — ABNORMAL HIGH (ref 2.5–4.6)
Phosphorus: 6.7 mg/dL — ABNORMAL HIGH (ref 2.5–4.6)

## 2018-04-07 LAB — HEPARIN LEVEL (UNFRACTIONATED)
HEPARIN UNFRACTIONATED: 1.05 [IU]/mL — AB (ref 0.30–0.70)
Heparin Unfractionated: 1.07 IU/mL — ABNORMAL HIGH (ref 0.30–0.70)

## 2018-04-07 LAB — CK: CK TOTAL: 23390 U/L — AB (ref 49–397)

## 2018-04-07 LAB — LACTIC ACID, PLASMA
Lactic Acid, Venous: 13.2 mmol/L (ref 0.5–1.9)
Lactic Acid, Venous: 12.9 mmol/L (ref 0.5–1.9)
Lactic Acid, Venous: 10.7 mmol/L (ref 0.5–1.9)
Lactic Acid, Venous: 10 mmol/L (ref 0.5–1.9)
Lactic Acid, Venous: 9.3 mmol/L (ref 0.5–1.9)

## 2018-04-07 LAB — APTT
aPTT: 114 seconds — ABNORMAL HIGH (ref 24–36)
aPTT: 113 seconds — ABNORMAL HIGH (ref 24–36)

## 2018-04-07 LAB — MRSA PCR SCREENING: MRSA BY PCR: NEGATIVE

## 2018-04-07 MED ORDER — FENTANYL CITRATE (PF) 100 MCG/2ML IJ SOLN
50.0000 ug | Freq: Once | INTRAMUSCULAR | Status: AC
  Administered 2018-04-07: 50 ug via INTRAVENOUS

## 2018-04-07 MED ORDER — NOREPINEPHRINE BITARTRATE 1 MG/ML IV SOLN
0.0000 ug/min | INTRAVENOUS | Status: DC
  Administered 2018-04-07: 30 ug/min via INTRAVENOUS
  Administered 2018-04-07: 10 ug/min via INTRAVENOUS
  Administered 2018-04-08: 6 ug/min via INTRAVENOUS
  Filled 2018-04-07 (×4): qty 16

## 2018-04-07 MED ORDER — SODIUM CHLORIDE 0.9 % IV BOLUS
500.0000 mL | Freq: Once | INTRAVENOUS | Status: AC
  Administered 2018-04-07: 500 mL via INTRAVENOUS

## 2018-04-07 MED ORDER — PROPOFOL 1000 MG/100ML IV EMUL
5.0000 ug/kg/min | INTRAVENOUS | Status: DC
  Administered 2018-04-07: 20 ug/kg/min via INTRAVENOUS

## 2018-04-07 MED ORDER — PIPERACILLIN-TAZOBACTAM 3.375 G IVPB
3.3750 g | Freq: Two times a day (BID) | INTRAVENOUS | Status: DC
  Administered 2018-04-07 – 2018-04-09 (×4): 3.375 g via INTRAVENOUS
  Filled 2018-04-07 (×4): qty 50

## 2018-04-07 MED ORDER — DEXTROSE 50 % IV SOLN
25.0000 mL | Freq: Once | INTRAVENOUS | Status: AC
  Administered 2018-04-07: 25 mL via INTRAVENOUS
  Filled 2018-04-07: qty 50

## 2018-04-07 MED ORDER — SODIUM BICARBONATE 8.4 % IV SOLN
INTRAVENOUS | Status: DC
  Administered 2018-04-07 (×2): via INTRAVENOUS
  Filled 2018-04-07 (×2): qty 150

## 2018-04-07 MED ORDER — CEFTRIAXONE SODIUM 2 G IJ SOLR
2.0000 g | INTRAMUSCULAR | Status: DC
  Administered 2018-04-07: 2 g via INTRAVENOUS
  Filled 2018-04-07 (×2): qty 20

## 2018-04-07 MED ORDER — AZITHROMYCIN 500 MG IV SOLR
500.0000 mg | INTRAVENOUS | Status: DC
  Administered 2018-04-07: 500 mg via INTRAVENOUS
  Filled 2018-04-07: qty 500

## 2018-04-07 MED ORDER — VECURONIUM BROMIDE 10 MG IV SOLR
10.0000 mg | INTRAVENOUS | Status: AC
  Administered 2018-04-07: 10 mg via INTRAVENOUS

## 2018-04-07 MED ORDER — INSULIN ASPART 100 UNIT/ML ~~LOC~~ SOLN
0.0000 [IU] | SUBCUTANEOUS | Status: DC
  Administered 2018-04-10 – 2018-04-11 (×2): 1 [IU] via SUBCUTANEOUS
  Filled 2018-04-07 (×3): qty 1

## 2018-04-07 MED ORDER — SODIUM BICARBONATE 8.4 % IV SOLN
100.0000 meq | Freq: Once | INTRAVENOUS | Status: AC
  Administered 2018-04-07: 100 meq via INTRAVENOUS
  Filled 2018-04-07: qty 100

## 2018-04-07 MED ORDER — HYDROCORTISONE NA SUCCINATE PF 100 MG IJ SOLR
100.0000 mg | Freq: Three times a day (TID) | INTRAMUSCULAR | Status: DC
  Administered 2018-04-07 – 2018-04-08 (×4): 100 mg via INTRAVENOUS
  Filled 2018-04-07 (×4): qty 2

## 2018-04-07 MED ORDER — FENTANYL BOLUS VIA INFUSION
50.0000 ug | INTRAVENOUS | Status: DC | PRN
  Filled 2018-04-07: qty 50

## 2018-04-07 MED ORDER — MIDAZOLAM HCL 2 MG/2ML IJ SOLN: 2.0000 mg | INTRAMUSCULAR | Status: DC | PRN

## 2018-04-07 MED ORDER — VECURONIUM BROMIDE 10 MG IV SOLR
INTRAVENOUS | Status: AC
  Administered 2018-04-07: 10 mg via INTRAVENOUS
  Filled 2018-04-07: qty 10

## 2018-04-07 MED ORDER — SODIUM CHLORIDE 0.9 % IV SOLN
0.0000 ug/min | INTRAVENOUS | Status: DC
  Administered 2018-04-07: 150 ug/min via INTRAVENOUS
  Filled 2018-04-07: qty 4
  Filled 2018-04-07: qty 40
  Filled 2018-04-07 (×2): qty 4

## 2018-04-07 MED ORDER — FENTANYL 2500MCG IN NS 250ML (10MCG/ML) PREMIX INFUSION
25.0000 ug/h | INTRAVENOUS | Status: DC
  Administered 2018-04-07 – 2018-04-10 (×2): 50 ug/h via INTRAVENOUS
  Administered 2018-04-11: 100 ug/h via INTRAVENOUS
  Filled 2018-04-07 (×3): qty 250

## 2018-04-07 MED ORDER — VASOPRESSIN 20 UNIT/ML IV SOLN
0.0300 [IU]/min | INTRAVENOUS | Status: DC
  Administered 2018-04-07: 0.03 [IU]/min via INTRAVENOUS
  Filled 2018-04-07: qty 2

## 2018-04-07 MED ORDER — HEPARIN SODIUM (PORCINE) 5000 UNIT/ML IJ SOLN: 5000.0000 [IU] | Freq: Three times a day (TID) | INTRAMUSCULAR | Status: DC

## 2018-04-07 MED ORDER — SODIUM CHLORIDE 0.9 % IV BOLUS
1000.0000 mL | Freq: Once | INTRAVENOUS | Status: AC
  Administered 2018-04-07: 1000 mL via INTRAVENOUS

## 2018-04-07 MED ORDER — SODIUM CHLORIDE 0.9% FLUSH: 10.0000 mL | INTRAVENOUS | Status: DC | PRN

## 2018-04-07 MED ORDER — SODIUM BICARBONATE 8.4 % IV SOLN
100.0000 meq | Freq: Once | INTRAVENOUS | Status: AC
  Administered 2018-04-07: 100 meq via INTRAVENOUS
  Filled 2018-04-07: qty 150

## 2018-04-07 MED ORDER — LEVETIRACETAM IN NACL 1000 MG/100ML IV SOLN: 1000.0000 mg | Freq: Once | INTRAVENOUS | Status: DC

## 2018-04-07 MED ORDER — SODIUM CHLORIDE 0.9 % IV SOLN
0.5000 mg/h | INTRAVENOUS | Status: DC
  Administered 2018-04-07: 1 mg/h via INTRAVENOUS
  Administered 2018-04-08: 7 mg/h via INTRAVENOUS
  Administered 2018-04-08: 4 mg/h via INTRAVENOUS
  Administered 2018-04-09 (×3): 7 mg/h via INTRAVENOUS
  Administered 2018-04-10: 5 mg/h via INTRAVENOUS
  Administered 2018-04-10 (×3): 7 mg/h via INTRAVENOUS
  Administered 2018-04-11 – 2018-04-12 (×5): 8 mg/h via INTRAVENOUS
  Filled 2018-04-07 (×16): qty 10

## 2018-04-07 MED ORDER — DOPAMINE-DEXTROSE 3.2-5 MG/ML-% IV SOLN
2.0000 ug/kg/min | INTRAVENOUS | Status: DC
  Administered 2018-04-07: 15 ug/kg/min via INTRAVENOUS
  Administered 2018-04-07: 17 ug/kg/min via INTRAVENOUS
  Administered 2018-04-08: 10 ug/kg/min via INTRAVENOUS
  Filled 2018-04-07 (×2): qty 250

## 2018-04-07 MED ORDER — SODIUM CHLORIDE 0.9 % IV SOLN
1000.0000 mg | Freq: Once | INTRAVENOUS | Status: AC
  Administered 2018-04-07: 1000 mg via INTRAVENOUS
  Filled 2018-04-07: qty 10

## 2018-04-07 MED ORDER — STERILE WATER FOR INJECTION IV SOLN
INTRAVENOUS | Status: DC
  Administered 2018-04-07 (×2): via INTRAVENOUS
  Filled 2018-04-07 (×4): qty 150

## 2018-04-07 MED ORDER — SODIUM BICARBONATE 8.4 % IV SOLN
50.0000 meq | Freq: Once | INTRAVENOUS | Status: AC
  Administered 2018-04-07: 50 meq via INTRAVENOUS

## 2018-04-07 MED ORDER — SODIUM CHLORIDE 0.9 % IV SOLN
750.0000 mg | Freq: Two times a day (BID) | INTRAVENOUS | Status: DC
  Administered 2018-04-07 – 2018-04-10 (×8): 750 mg via INTRAVENOUS
  Filled 2018-04-07 (×10): qty 7.5

## 2018-04-07 MED ORDER — HEPARIN (PORCINE) IN NACL 100-0.45 UNIT/ML-% IJ SOLN
400.0000 [IU]/h | INTRAMUSCULAR | Status: DC
  Administered 2018-04-07: 700 [IU]/h via INTRAVENOUS
  Filled 2018-04-07: qty 250

## 2018-04-07 NOTE — Progress Notes (Signed)
Patient reach goal temperature of 36 degree at 3 am.

## 2018-04-07 NOTE — Progress Notes (Signed)
[facility] - [place] at [facility]   PATIENT NAME: Brian Boyle    MR#:  [id number]  DATE OF BIRTH:  1956-10-19  SUBJECTIVE: Status post intubation, slight sedation, on code ice protocol.  On low-dose pressors with dopamine.  Patient had cardiac arrest and on downtime around 40 minutes, 11 times defibrillator used as per chart.  Started on Keppra for possible myoclonic jerks.  EEG pending.  CHIEF COMPLAINT:   Chief Complaint  Patient presents with  . Cardiac Arrest    REVIEW OF SYSTEMS:   Review of Systems  Unable to perform ROS: Acuity of condition    physical examination Critically ill,, intubated, code ice is being followed  HEENT: Head atraumatic, normocephalic.  Pupils not reacting NECK:  no jugular venous distention. No thyroid enlargement. intubated.  LUNGS: Mostly clear to auscultation, no wheeze CARDIOVASCULAR: S1, S2 normal. No murmurs, rubs, or gallops.  ABDOMEN: Soft, nondistended.,  Bowel sounds poor. EXTREMITIES: No pedal edema,cyanosis, or clubbing.  NEUROLOGIC: Unable to do full neurological exam because patient is intubated, sedated..  Flaccid throughout with no corneal reflex, reduced back reflux, no plantar reflex. SKIN: No obvious rash, lesion, or ulcer.    LABORATORY PANEL:   Urology note patient CBC Recent Labs  Lab 04/07/18 0353  WBC 21.5*  HGB 15.3  HCT 44.9  PLT 117*   ------------------------------------------------------------------------------------------------------------------  Chemistries  Recent Labs  Lab 05-05-18 2146  04/07/18 1551  NA 144   < > 144  K 5.0   < > 5.0  CL 105   < > 102  CO2 15*   < > 26  GLUCOSE 89   < > 112*  BUN 29*   < > 39*  CREATININE 2.71*   < > 3.18*  CALCIUM 9.0   < > 6.9*  MG  --    < > 2.1  AST 972*  --   --   ALT 944*  --   --   ALKPHOS 81  --   --   BILITOT 1.4*  --   --    < > = values in this interval not displayed.    ------------------------------------------------------------------------------------------------------------------  Cardiac Enzymes Recent Labs  Lab 04/07/18 0850  TROPONINI >65.00*   ------------------------------------------------------------------------------------------------------------------  RADIOLOGY:  Dg Chest 1 View  Result Date: 04/07/2018 CLINICAL DATA:  Endotracheal tube placement EXAM: CHEST  1 VIEW COMPARISON:  2018-05-05 FINDINGS: Endotracheal tube has been placed with tip measuring 3.4 cm above the carina. Right central venous catheter with tip over the mid SVC region. No pneumothorax. Enteric tube tip is off the field of view but below the left hemidiaphragm. Tubing over the left chest consistent with ventricular peritoneal shunt tubing. Cardiac enlargement with mild pulmonary vascular congestion. No consolidation or edema. No blunting of costophrenic angles. Mediastinal contours appear intact. IMPRESSION: Appliances appear in satisfactory position. Cardiac enlargement. Mild pulmonary vascular congestion. No edema or consolidation. Electronically Signed   By: Burman Nieves M.D.   On: 04/07/2018 00:31   Dg Abd 1 View  Result Date: 04/07/2018 CLINICAL DATA:  OG tube placement EXAM: ABDOMEN - 1 VIEW COMPARISON:  05-05-18 FINDINGS: Enteric tube tip is in the left mid abdomen consistent with location in the distal stomach. Visualized colon is not abnormally distended. Ventricular peritoneal shunt tubing is present. IMPRESSION: Enteric tube tip is in the left mid abdomen consistent with location in the distal stomach. Electronically Signed   By: Burman Nieves M.D.   On: 04/07/2018 00:32  Ct Head Wo Contrast  Result Date: 03/15/2018 CLINICAL DATA:  Altered LOC, cardiac arrest EXAM: CT HEAD WITHOUT CONTRAST TECHNIQUE: Contiguous axial images were obtained from the base of the skull through the vertex without intravenous contrast. COMPARISON:  None. FINDINGS: Brain: No acute  territorial infarction or hemorrhage is visualized. Mild encephalomalacia in the left frontal lobe adjacent to ventricular catheter. Left frontal catheter terminates at the midline posteriorly, just above the pineal region. Slight asymmetric appearance of the ventricles, right larger than left but no ventricular enlargement. Possible small focus of encephalomalacia in the left posterior temporal lobe. Vascular: No hyperdense vessels. Scattered calcifications at the carotid siphons. Skull: No fracture Sinuses/Orbits: Mild mucosal thickening in the ethmoid sinuses. No acute orbital abnormality Other: None IMPRESSION: 1. No definite CT evidence for acute intracranial abnormality. 2. Left frontal ventricular catheter with adjacent encephalomalacia in the frontal lobe. Slight asymmetric appearance of ventricles, right larger than left but without hydrocephalus. Electronically Signed   By: Jasmine Pang M.D.   On: 03/29/2018 23:53   Dg Chest Portable 1 View  Result Date: 03/28/2018 CLINICAL DATA:  Post code EXAM: PORTABLE CHEST 1 VIEW COMPARISON:  None. FINDINGS: Endotracheal tube tip is about 6.1 cm superior to carina. Esophageal tube tip projects over the distal esophagus. Right IJ central venous catheter tip over the SVC. No pneumothorax. No focal opacity or pleural effusion. Mild cardiomegaly. Probable left-sided shunt catheter. IMPRESSION: 1. Endotracheal tube tip about 6.1 cm superior to carina 2. Esophageal tube tip overlies the distal esophagus, further advancement recommended for more optimal positioning. 3. Mild cardiomegaly without acute infiltrate 4. Right IJ central venous catheter tip over the SVC without pneumothorax. Electronically Signed   By: Jasmine Pang M.D.   On: 03/21/2018 22:23   Dg Abd Portable 1 View  Result Date: 03/31/2018 CLINICAL DATA:  OG tube placement EXAM: PORTABLE ABDOMEN - 1 VIEW COMPARISON:  None. FINDINGS: The tip of the esophageal tube may be visible near the distal  esophagus. Mild gaseous enlargement of stomach. Left-sided shunt tubing courses toward the pelvis but is incompletely imaged. Nonobstructed gas pattern. IMPRESSION: Esophageal tube tip appears to overlie the distal esophagus. Further advancement recommended for more optimal positioning Electronically Signed   By: Jasmine Pang M.D.   On: 03/21/2018 22:24    EKG:   Orders placed or performed during the hospital encounter of 03/26/2018  . EKG 12-Lead  . EKG 12-Lead  . EKG 12-Lead  . EKG 12-Lead  . EKG 12-Lead  . EKG 12-Lead    ASSESSMENT AND PLAN:  1.  Acute respiratory failure, status post cardiac arrest.   Prolonged resuscitation effort, continue full vent support, management as as per ICU physician.  Patient still requiring FiO2 of 80%.-Continue to keep.  2.  Cardiac arrest, possibly secondary to V. Tach episode.  Continue to monitor on telemetry.  Cardiology is consulted for further evaluation and treatment.  Patient troponin up at 65, followed by cardiology Dr. Adrian Blackwater.  Follow echocardiogram, patient to unstable to have any cardiac evaluation.  Possible torsades, V. fib arrest, r status post shock 11 times..  3.  Cardiogenic shock.  Continue vent support and pressor support.  4.  Elevated troponin levels, that is post cardiac arrest.  We will continue to monitor troponin levels.  Cardiology is consulted for further recommendations.,  Patient has a history of left ventricular mural thrombus as per his medical records in care everywhere, echocardiogram in 2018 March showed EF is around 10%.  Patient had  echo done but results are pending.. 5.  Acute metabolic acidosis, status post cardiac arrest and acute respiratory failure.  On bicarb drip.  Continue to monitor closely in intensive care unit. 6.  Acute renal failure, likely ATN, secondary to hypotension.  We will continue IV fluids and monitor kidney function closely.  Avoid nephrotoxic medications. 7.  Acute encephalopathy, status post  cardiac arrest and acute respiratory failure.  There is high likelihood for anoxic brain injury.    Started on Keppra for myoclonic jerks, EEG is pending.  Appreciate neurology following.. 8.  HIV,,[followed by ID physicians at [facility], has history of HIV for a long time, history of cryptococcal meningitis in 2014, patient supposed to be on GENYOVA 9.  Elevated liver function test,; shock liver:   Prognosis poor, high risk for recurrent cardiac arrest Recommend DNR status   All the records are reviewed and case discussed with Care Management/Social Workerr. Management plans discussed with the patient, family and they are in agreement.  CODE STATUS: full  TOTAL TIME TAKING CARE OF THIS PATIENT: 38 minutes.   Too unstable at this time.   Katha Hamming M.D on 04/07/2018 at 5:07 PM  Between 7am to 6pm - Pager - [phone]  After 6pm go to [email or web address] - password [email or web address]  [facility]  Office  [phone]  CC: Primary care physician; Patient, No Pcp Per   Note: This dictation was prepared with Dragon dictation along with smaller phrase technology. Any transcriptional errors that result from this process are unintentional.

## 2018-04-07 NOTE — Progress Notes (Signed)
Pharmacy Antibiotic Note  Brian Boyle is a 62 y.o. male admitted on 03/22/2018 s/p cardiac arrest and placed on TTM protocol.  Pharmacy has been consulted for Zosyn dosing for possible aspiration. Patient was previously ordered ceftriaxone.   Plan: Zosyn 3.375 g EI q 12 hours with patient in ARF.   Height:  (175.3 cm) Weight: 143 lb 15.4 oz (65.3 kg)(Patient on artic sun) IBW/kg (Calculated) : 70.7  Temp (24hrs), Avg:96.7 F (35.9 C), Min:95.5 F (35.3 C), Max:97.6 F (36.4 C)  Recent Labs  Lab 03/08/2018 2146 03/19/2018 2147 04/07/18 0108 04/07/18 0353 04/07/18 0416 04/07/18 0850 04/07/18 1114  WBC 15.4*  --   --  21.5*  --   --   --   CREATININE 2.71*  --   --  2.63*  --  3.11*  --   LATICACIDVEN  --  13.2* 12.9*  --  10.7* 10.0* 9.3*    Estimated Creatinine Clearance: 23.6 mL/min (A) (by C-G formula based on SCr of 3.11 mg/dL (H)).    No Known Allergies  Antimicrobials this admission: Azithromycin and CTX x 1 Zosyn 5/1 >>   Dose adjustments this admission:   Microbiology results: 5/1 BCx: NGTD 4/30 UCx: sent  5/1 TA: pending  4/30 MRSA PCR: negative  Thank you for allowing pharmacy to be a part of this patient's care.  Valentina Gu 04/07/2018 2:37 PM

## 2018-04-07 NOTE — Progress Notes (Signed)
Pharmacy Antibiotic Note  Jaevion Goto is a 62 y.o. male admitted on 03/15/2018 with UTI.  Pharmacy has been consulted for ceftriaxone dosing.  Plan: Ceftriaxone 2 grams q 24 hours ordered.  Height:  (175.3 cm) Weight: 127 lb 10.3 oz (57.9 kg) IBW/kg (Calculated) : 70.7  Temp (24hrs), Avg:97.6 F (36.4 C), Min:97.6 F (36.4 C), Max:97.6 F (36.4 C)  Recent Labs  Lab 04/05/2018 2146 04/05/2018 2147  WBC 15.4*  --   CREATININE 2.71*  --   LATICACIDVEN  --  13.2*    Estimated Creatinine Clearance: 24 mL/min (A) (by C-G formula based on SCr of 2.71 mg/dL (H)).    No Known Allergies  Antimicrobials this admission: ceftriaxone 5/1 >>    >>   Dose adjustments this admission:   Microbiology results: 5/1 BCx: pending 4/30 UCx: pending  5/1 Sputum: pending  4/30 MRSA PCR: (-)      5/1 UA: LE(-)  NO2(-)  WBC 6-10 5/1 CXR: No edema or consolidation  Thank you for allowing pharmacy to be a part of this patient's care.  Aideliz Garmany S 04/07/2018 2:18 AM

## 2018-04-07 NOTE — Progress Notes (Signed)
ANTICOAGULATION CONSULT NOTE - Initial Consult  Pharmacy Consult for heparin drip Indication: chest pain/ACS  No Known Allergies  Patient Measurements: Height:  (175.3 cm) Weight: 127 lb 10.3 oz (57.9 kg) IBW/kg (Calculated) : 70.7 Heparin Dosing Weight: 58 kg  Vital Signs: Temp: 97.6 F (36.4 C) (04/30 2216) Temp Source: Rectal (04/30 2216) BP: 84/73 (05/01 0100) Pulse Rate: 43 (04/30 2312)  Labs: Recent Labs    03/08/2018 2146 04/07/18 0108  HGB 14.5  --   HCT 44.1  --   PLT 143*  --   APTT 39*  --   LABPROT 20.6*  --   INR 1.79  --   HEPARINUNFRC  --  1.07*  CREATININE 2.71*  --   TROPONINI 17.60*  --     Estimated Creatinine Clearance: 24 mL/min (A) (by C-G formula based on SCr of 2.71 mg/dL (H)).   Medical History: Past Medical History:  Diagnosis Date  . HIV (human immunodeficiency virus infection) (HCC)     Medications:  On Eliquis PTA   Assessment: Trop 17.6  Goal of Therapy:  Heparin level 0.3-0.7 units/ml  APTT 66-102 Monitor platelets by anticoagulation protocol: Yes   Plan:  3600 unit bolus given in ED. 700 units/hr as continuation. Heparin level 1.07. Drawn after bolus given so unable to discern effect from previous apixaban. Check aPTT and heparin level 6 hours after start of infusion. Will follow by heparin level alone if aPTT and heparin levels correlate.  Rhealynn Myhre S 04/07/2018,2:31 AM

## 2018-04-07 NOTE — Progress Notes (Addendum)
Initial Nutrition Assessment  DOCUMENTATION CODES:   Non-severe (moderate) malnutrition in context of chronic illness  INTERVENTION:   Pt is not hemodynamically stable and not appropriate for EN at this time.  If patient becomes hemodynamically stable, recommend Vital AF 1.2 @ 55 mL/hr. This provides 1584 kcal, 99 gm protein, 1069 mL water.   Recommend 200 mL QID water flushes to provide a total of 1869 mL water/day.  Pt at high refeeding risk- will continue to monitor  NUTRITION DIAGNOSIS:   Moderate Malnutrition related to chronic illness(AIDS, CHF) as evidenced by moderate fat depletion, moderate muscle depletion.  GOAL:   Provide needs based on ASPEN/SCCM guidelines  MONITOR:   Vent status, Labs, I & O's, Weight trends  REASON FOR ASSESSMENT:   Ventilator   ASSESSMENT:  62 y.o. male with a known history of HIV & AIDS, chronic systolic CHF, nonsustained ventricular tachycardia, cardiomyopathy, left ventricular mural thrombus, anxiety/depression disorder, cryptococcal meningoencephalitis and melanoma. He was admitted for cardiac arrest and is on the 36 degrees C temperature management protocol.  Pt is intubated and sedated. No family present at time of visit. Noted pt on 36 degree C temperature management protocol started at 12:42 am 5/1. Noted pt with mottling on bilateral feet. Will continue to monitor for improvements and ability to initiate nutrition support. Also noted pt's elevated phosphorus and magnesium lab values and pt receiving dextrose 5%- pt is at high refeeding risk.  Access: NG/OG tube placed 03/14/2018. Confirmed placement in the left mid abdomen per abdominal x-ray 5/1. 70 cm corner of mouth.  MAP: 37-172 mmHg since midnight 5/1 (>60 since 2am) Pressors: Norepinephrine 25 mcg/min, Dopamine 19.1 mL/hr in dextrose 5% (provides 78 kcal)  Patient is currently intubated on ventilator support Ve: 9.2 L/min Temp (24hrs), Avg:96.6 F (35.9 C), Min:95.5 F (35.3  C), Max:97.6 F (36.4 C)  Propofol: N/A  Medications reviewed and include: colace, dopamine in dextrose 5% (provides 78 kcal), famotidine, fentanyl, heparin, midazolam, norepinephrine 25 mcg/min, zosyn, sodium bicarbonate 125 ml/hr  Labs reviewed: K 4.3 wnl, BUN 37(H), Creatinine 3.11(H), Calcium 6.8(L), Phos 6.5(H), Magnesium 2.5(H), GFR 20(L), Lactic acid 10(H), WBC 21.5(H), glucose 89-257 x 24 hrs  I/O: In 2491 mL via IV; Out: 152  ML via urine  Weight trend: No wt history in pt chart. Weight history obtained from [facility] care and pt weighed 124 lbs in December 2018. Do not suspect recent significant weight loss.  Reviewed with nurse and on rounds.   NUTRITION - FOCUSED PHYSICAL EXAM:    Most Recent Value  Orbital Region  Mild depletion  Upper Arm Region  Unable to assess  Thoracic and Lumbar Region  Unable to assess  Buccal Region  Moderate depletion  Temple Region  Unable to assess  Clavicle Bone Region  Moderate depletion  Clavicle and Acromion Bone Region  Severe depletion  Scapular Bone Region  Unable to assess  Dorsal Hand  Mild depletion  Patellar Region  Severe depletion  Anterior Thigh Region  Severe depletion  Posterior Calf Region  Unable to assess  Edema (RD Assessment)  None  Hair  Reviewed  Eyes  Unable to assess  Mouth  Unable to assess  Skin  Reviewed [noted mottling of bilateral feet]  Nails  Reviewed      Diet Order:   Diet Order    None      EDUCATION NEEDS:   Not appropriate for education at this time  Skin:  Skin Assessment: Skin Integrity Issues: Skin Integrity  Issues:: Other (Comment) Other: mottled skin on bilateral feet  Last BM:  unknown  Height:   Ht Readings from Last 1 Encounters:  04/07/18  (1.753 m)    Weight:   Wt Readings from Last 1 Encounters:  04/07/18 143 lb 15.4 oz (65.3 kg)    Ideal Body Weight:  72.7 kg  BMI:  Body mass index is 21.26 kg/m.  Estimated Nutritional Needs:   Kcal:  1556 kcal  (PSU)  Protein:  97-110 gm/day (ABW x 1.5-1.7)  Fluid:  >1.9 L/day (30 ml/kg) or per MD  Clydene Pugh, MS Dietetic Intern [phone]

## 2018-04-07 NOTE — Consult Note (Addendum)
Brian Boyle is a 62 y.o. male  [id number]  Primary Cardiologist: New to Dr. Neoma Laming Reason for Consultation: Brian Boyle and cardiac arrest  HPI: 62 yo male with a past medical history of HIV, chronic systolic CHF, left ventricular mural thrombus with MI, and non-sustained ventricular tachycardia who returned home from gym and became unresponsive foaming at the mouth. Pt's mother called EMS. Initial cardiac rhythm was ventricular tachycardia vs torsades. He was defibrillated 11 times for a total of 40 minutes of resusitation.  He was intubated and placed on ventilator in the ER. Currently sedated, troponin is >65, and creatinine is 2.6. He is on a dopamine drip and RN reports dropping of heart rate and blood pressure with any attempt to wean.    Review of Systems: Intubated and unresponsive.   Past Medical History:  Diagnosis Date  . HIV (human immunodeficiency virus infection) ([place])     No medications prior to admission.     Marland Kitchen aspirin  324 mg Oral Once  . chlorhexidine gluconate (MEDLINE KIT)  15 mL Mouth Rinse BID  . docusate sodium  100 mg Oral BID  . hydrocortisone sod succinate (SOLU-CORTEF) inj  100 mg Intravenous Q8H  . mouth rinse  15 mL Mouth Rinse 10 times per day    Infusions: . sodium chloride 20 mL/hr at 04/02/2018 2144  . azithromycin Stopped (04/07/18 3491)  . cefTRIAXone (ROCEPHIN) IVPB 2 gram/100 mL NS (Mini-Bag Plus) Stopped (04/07/18 0329)  . DOPamine 17 mcg/kg/min (04/07/18 0700)  . famotidine (PEPCID) IV Stopped (04/07/18 0052)  . fentaNYL infusion INTRAVENOUS 50 mcg/hr (04/07/18 0700)  . heparin 700 Units/hr (04/07/18 0700)  . levETIRAcetam    . midazolam (VERSED) infusion 1 mg/hr (04/07/18 0700)  . norepinephrine (LEVOPHED) Adult infusion 30 mcg/min (04/07/18 0827)  . phenylephrine (NEO-SYNEPHRINE) Adult infusion Stopped (04/07/18 0745)  .  sodium bicarbonate  infusion 1000 mL 125 mL/hr at 04/07/18 0700  . vasopressin (PITRESSIN) infusion - *FOR  SHOCK* 0.02 Units/min (04/07/18 0815)    No Known Allergies  Social History   Socioeconomic History  . Marital status: Single    Spouse name: Not on file  . Number of children: Not on file  . Years of education: Not on file  . Highest education level: Not on file  Occupational History  . Not on file  Social Needs  . Financial resource strain: Not on file  . Food insecurity:    Worry: Not on file    Inability: Not on file  . Transportation needs:    Medical: Not on file    Non-medical: Not on file  Tobacco Use  . Smoking status: Not on file  Substance and Sexual Activity  . Alcohol use: Not on file  . Drug use: Not on file  . Sexual activity: Not on file  Lifestyle  . Physical activity:    Days per week: Not on file    Minutes per session: Not on file  . Stress: Not on file  Relationships  . Social connections:    Talks on phone: Not on file    Gets together: Not on file    Attends religious service: Not on file    Active member of club or organization: Not on file    Attends meetings of clubs or organizations: Not on file    Relationship status: Not on file  . Intimate partner violence:    Fear of current or ex partner: Not on file    Emotionally abused:  Not on file    Physically abused: Not on file    Forced sexual activity: Not on file  Other Topics Concern  . Not on file  Social History Narrative  . Not on file    No family history on file.  PHYSICAL EXAM: Vitals:   04/07/18 0730 04/07/18 0800  BP:    Pulse:    Resp:    Temp: (!) 95.5 F (35.3 C) (!) 96.6 F (35.9 C)  SpO2:       Intake/Output Summary (Last 24 hours) at 04/07/2018 0841 Last data filed at 04/07/2018 0800 Gross per 24 hour  Intake 1740.3 ml  Output 127 ml  Net 1613.3 ml    General:  Unresponsive, intubated, sedated HEENT: normal Neck: supple. no JVD. Carotids 2+ bilat; no bruits. No lymphadenopathy or thryomegaly appreciated. Cor: PMI nondisplaced. Regular rate & rhythm. No  rubs, gallops or murmurs. Lungs: clear Abdomen: soft, nontender, nondistended. No hepatosplenomegaly. No bruits or masses. Good bowel sounds. Extremities: no cyanosis, clubbing, rash, edema Neuro: Unresponsive, intubated, sedated  ECG: Sinus rhythm 70bpm, NSIVCD, lateral MI probably recent, Non specific st and t changes, fusion beats  Results for orders placed or performed during the hospital encounter of 04/02/2018 (from the past 24 hour(s))  CBC with Differential/Platelet     Status: Abnormal   Collection Time: 03/14/2018  9:46 PM  Result Value Ref Range   WBC 15.4 (H) 3.8 - 10.6 K/uL   RBC 4.13 (L) 4.40 - 5.90 MIL/uL   Hemoglobin 14.5 13.0 - 18.0 g/dL   HCT 44.1 40.0 - 52.0 %   MCV 106.7 (H) 80.0 - 100.0 fL   MCH 35.2 (H) 26.0 - 34.0 pg   MCHC 33.0 32.0 - 36.0 g/dL   RDW 13.4 11.5 - 14.5 %   Platelets 143 (L) 150 - 440 K/uL   Neutrophils Relative % 88 %   Neutro Abs 13.6 (H) 1.4 - 6.5 K/uL   Lymphocytes Relative 6 %   Lymphs Abs 0.9 (L) 1.0 - 3.6 K/uL   Monocytes Relative 6 %   Monocytes Absolute 0.9 0.2 - 1.0 K/uL   Eosinophils Relative 0 %   Eosinophils Absolute 0.0 0 - 0.7 K/uL   Basophils Relative 0 %   Basophils Absolute 0.0 0 - 0.1 K/uL  Protime-INR     Status: Abnormal   Collection Time: 03/10/2018  9:46 PM  Result Value Ref Range   Prothrombin Time 20.6 (H) 11.4 - 15.2 seconds   INR 1.79   APTT     Status: Abnormal   Collection Time: 03/19/2018  9:46 PM  Result Value Ref Range   aPTT 39 (H) 24 - 36 seconds  Comprehensive metabolic panel     Status: Abnormal   Collection Time: 03/17/2018  9:46 PM  Result Value Ref Range   Sodium 144 135 - 145 mmol/L   Potassium 5.0 3.5 - 5.1 mmol/L   Chloride 105 101 - 111 mmol/L   CO2 15 (L) 22 - 32 mmol/L   Glucose, Bld 89 65 - 99 mg/dL   BUN 29 (H) 6 - 20 mg/dL   Creatinine, Ser 2.71 (H) 0.61 - 1.24 mg/dL   Calcium 9.0 8.9 - 10.3 mg/dL   Total Protein 6.2 (L) 6.5 - 8.1 g/dL   Albumin 3.7 3.5 - 5.0 g/dL   AST 972 (H) 15 - 41 U/L    ALT 944 (H) 17 - 63 U/L   Alkaline Phosphatase 81 38 - 126 U/L  Total Bilirubin 1.4 (H) 0.3 - 1.2 mg/dL   GFR calc non Af Amer 24 (L) >60 mL/min   GFR calc Af Amer 28 (L) >60 mL/min   Anion gap 24 (H) 5 - 15  Troponin I     Status: Abnormal   Collection Time: 03/25/2018  9:46 PM  Result Value Ref Range   Troponin I 17.60 (HH) <0.03 ng/mL  Lipid panel     Status: Abnormal   Collection Time: 03/31/2018  9:46 PM  Result Value Ref Range   Cholesterol 199 0 - 200 mg/dL   Triglycerides 75 <150 mg/dL   HDL 68 >40 mg/dL   Total CHOL/HDL Ratio 2.9 RATIO   VLDL 15 0 - 40 mg/dL   LDL Cholesterol 116 (H) 0 - 99 mg/dL  Lactic acid, plasma     Status: Abnormal   Collection Time: 03/28/2018  9:47 PM  Result Value Ref Range   Lactic Acid, Venous 13.2 (HH) 0.5 - 1.9 mmol/L  Blood gas, arterial     Status: Abnormal (Preliminary result)   Collection Time: 03/10/2018 10:19 PM  Result Value Ref Range   FIO2 1.00    Delivery systems VENTILATOR    Mode PRESSURE REGULATED VOLUME CONTROL    VT 500 mL   LHR 16 resp/min   Peep/cpap 5.0 cm H20   pH, Arterial PENDING 7.350 - 7.450   pCO2 arterial 27 (L) 32.0 - 48.0 mmHg   pO2, Arterial PENDING 83.0 - 108.0 mmHg   Bicarbonate 10.3 (L) 20.0 - 28.0 mmol/L   Acid-base deficit 16.3 (H) 0.0 - 2.0 mmol/L   O2 Saturation 99.8 %   Patient temperature 37.0    Collection site LEFT RADIAL    Sample type ARTERIAL DRAW    Allens test (pass/fail) PASS PASS  MRSA PCR Screening     Status: None   Collection Time: 03/19/2018 11:45 PM  Result Value Ref Range   MRSA by PCR NEGATIVE NEGATIVE  Glucose, capillary     Status: None   Collection Time: 03/29/2018 11:45 PM  Result Value Ref Range   Glucose-Capillary 91 65 - 99 mg/dL  Magnesium     Status: Abnormal   Collection Time: 04/07/18 12:38 AM  Result Value Ref Range   Magnesium 3.1 (H) 1.7 - 2.4 mg/dL  Procalcitonin - Baseline     Status: None   Collection Time: 04/07/18 12:38 AM  Result Value Ref Range    Procalcitonin 1.78 ng/mL  Urinalysis, Routine w reflex microscopic     Status: Abnormal   Collection Time: 04/07/18 12:40 AM  Result Value Ref Range   Color, Urine AMBER (A) YELLOW   APPearance CLOUDY (A) CLEAR   Specific Gravity, Urine 1.016 1.005 - 1.030   pH 6.0 5.0 - 8.0   Glucose, UA NEGATIVE NEGATIVE mg/dL   Hgb urine dipstick LARGE (A) NEGATIVE   Bilirubin Urine NEGATIVE NEGATIVE   Ketones, ur NEGATIVE NEGATIVE mg/dL   Protein, ur 100 (A) NEGATIVE mg/dL   Nitrite NEGATIVE NEGATIVE   Leukocytes, UA NEGATIVE NEGATIVE   RBC / HPF >50 (H) 0 - 5 RBC/hpf   WBC, UA 6-10 0 - 5 WBC/hpf   Bacteria, UA MANY (A) NONE SEEN   Squamous Epithelial / LPF NONE SEEN 0 - 5   Mucus PRESENT    Hyaline Casts, UA PRESENT    Sperm, UA PRESENT    Crystals PRESENT (A) NEGATIVE  Urine Drug Screen, Qualitative ([facility] only)     Status: None  Collection Time: 04/07/18 12:40 AM  Result Value Ref Range   Tricyclic, Ur Screen NONE DETECTED NONE DETECTED   Amphetamines, Ur Screen NONE DETECTED NONE DETECTED   MDMA (Ecstasy)Ur Screen NONE DETECTED NONE DETECTED   Cocaine Metabolite,Ur [place] NONE DETECTED NONE DETECTED   Opiate, Ur Screen NONE DETECTED NONE DETECTED   Phencyclidine (PCP) Ur S NONE DETECTED NONE DETECTED   Cannabinoid 50 Ng, Ur [place] NONE DETECTED NONE DETECTED   Barbiturates, Ur Screen NONE DETECTED NONE DETECTED   Benzodiazepine, Ur Scrn NONE DETECTED NONE DETECTED   Methadone Scn, Ur NONE DETECTED NONE DETECTED  Lactic acid, plasma     Status: Abnormal   Collection Time: 04/07/18  1:08 AM  Result Value Ref Range   Lactic Acid, Venous 12.9 (HH) 0.5 - 1.9 mmol/L  Heparin level (unfractionated)     Status: Abnormal   Collection Time: 04/07/18  1:08 AM  Result Value Ref Range   Heparin Unfractionated 1.07 (H) 0.30 - 0.70 IU/mL  Glucose, capillary     Status: Abnormal   Collection Time: 04/07/18  3:11 AM  Result Value Ref Range   Glucose-Capillary 118 (H) 65 - 99 mg/dL  CBC     Status:  Abnormal   Collection Time: 04/07/18  3:53 AM  Result Value Ref Range   WBC 21.5 (H) 3.8 - 10.6 K/uL   RBC 4.21 (L) 4.40 - 5.90 MIL/uL   Hemoglobin 15.3 13.0 - 18.0 g/dL   HCT 44.9 40.0 - 52.0 %   MCV 106.6 (H) 80.0 - 100.0 fL   MCH 36.4 (H) 26.0 - 34.0 pg   MCHC 34.1 32.0 - 36.0 g/dL   RDW 13.6 11.5 - 14.5 %   Platelets 117 (L) 150 - 440 K/uL  Procalcitonin     Status: None   Collection Time: 04/07/18  3:53 AM  Result Value Ref Range   Procalcitonin 2.47 ng/mL  Troponin I     Status: Abnormal   Collection Time: 04/07/18  3:53 AM  Result Value Ref Range   Troponin I >65.00 (HH) <0.03 ng/mL  Basic metabolic panel     Status: Abnormal   Collection Time: 04/07/18  3:53 AM  Result Value Ref Range   Sodium 147 (H) 135 - 145 mmol/L   Potassium 4.2 3.5 - 5.1 mmol/L   Chloride 110 101 - 111 mmol/L   CO2 16 (L) 22 - 32 mmol/L   Glucose, Bld 186 (H) 65 - 99 mg/dL   BUN 31 (H) 6 - 20 mg/dL   Creatinine, Ser 2.63 (H) 0.61 - 1.24 mg/dL   Calcium 7.2 (L) 8.9 - 10.3 mg/dL   GFR calc non Af Amer 25 (L) >60 mL/min   GFR calc Af Amer 29 (L) >60 mL/min   Anion gap 21 (H) 5 - 15  CULTURE, BLOOD (ROUTINE X 2) w Reflex to ID Panel     Status: None (Preliminary result)   Collection Time: 04/07/18  3:54 AM  Result Value Ref Range   Specimen Description BLOOD LEFT ANTECUBITAL    Special Requests      BOTTLES DRAWN AEROBIC AND ANAEROBIC Blood Culture adequate volume   Culture      NO GROWTH < 12 HOURS Performed at [facility], [address] [place], [place] [address]    Report Status PENDING   CULTURE, BLOOD (ROUTINE X 2) w Reflex to ID Panel     Status: None (Preliminary result)   Collection Time: 04/07/18  4:05 AM  Result  Value Ref Range   Specimen Description BLOOD LEFT WRIST    Special Requests      BOTTLES DRAWN AEROBIC AND ANAEROBIC Blood Culture adequate volume   Culture      NO GROWTH < 12 HOURS Performed at [facility], [address] [place], [place]  [address]    Report Status PENDING   Lactic acid, plasma     Status: Abnormal   Collection Time: 04/07/18  4:16 AM  Result Value Ref Range   Lactic Acid, Venous 10.7 (HH) 0.5 - 1.9 mmol/L  Arterial Blood Gas     Status: Abnormal   Collection Time: 04/07/18  5:00 AM  Result Value Ref Range   FIO2 50.00    Delivery systems VENTILATOR    Mode PRESSURE REGULATED VOLUME CONTROL    VT 500.0 mL   Peep/cpap 5.0 cm H20   pH, Arterial 7.19 (LL) 7.350 - 7.450   pCO2 arterial 37 32.0 - 48.0 mmHg   pO2, Arterial 65 (L) 83.0 - 108.0 mmHg   Bicarbonate 14.1 (L) 20.0 - 28.0 mmol/L   Acid-base deficit 13.3 (H) 0.0 - 2.0 mmol/L   O2 Saturation 86.4 %   Patient temperature 37.0    Collection site RIGHT RADIAL    Sample type ARTERIAL DRAW    Allens test (pass/fail) PASS PASS   Mechanical Rate 20.0    Dg Chest 1 View  Result Date: 04/07/2018 CLINICAL DATA:  Endotracheal tube placement EXAM: CHEST  1 VIEW COMPARISON:  03/28/2018 FINDINGS: Endotracheal tube has been placed with tip measuring 3.4 cm above the carina. Right central venous catheter with tip over the mid SVC region. No pneumothorax. Enteric tube tip is off the field of view but below the left hemidiaphragm. Tubing over the left chest consistent with ventricular peritoneal shunt tubing. Cardiac enlargement with mild pulmonary vascular congestion. No consolidation or edema. No blunting of costophrenic angles. Mediastinal contours appear intact. IMPRESSION: Appliances appear in satisfactory position. Cardiac enlargement. Mild pulmonary vascular congestion. No edema or consolidation. Electronically Signed   By: Lucienne Capers M.D.   On: 04/07/2018 00:31   Dg Abd 1 View  Result Date: 04/07/2018 CLINICAL DATA:  OG tube placement EXAM: ABDOMEN - 1 VIEW COMPARISON:  03/30/2018 FINDINGS: Enteric tube tip is in the left mid abdomen consistent with location in the distal stomach. Visualized colon is not abnormally distended. Ventricular peritoneal shunt  tubing is present. IMPRESSION: Enteric tube tip is in the left mid abdomen consistent with location in the distal stomach. Electronically Signed   By: Lucienne Capers M.D.   On: 04/07/2018 00:32   Ct Head Wo Contrast  Result Date: 03/25/2018 CLINICAL DATA:  Altered LOC, cardiac arrest EXAM: CT HEAD WITHOUT CONTRAST TECHNIQUE: Contiguous axial images were obtained from the base of the skull through the vertex without intravenous contrast. COMPARISON:  None. FINDINGS: Brain: No acute territorial infarction or hemorrhage is visualized. Mild encephalomalacia in the left frontal lobe adjacent to ventricular catheter. Left frontal catheter terminates at the midline posteriorly, just above the pineal region. Slight asymmetric appearance of the ventricles, right larger than left but no ventricular enlargement. Possible small focus of encephalomalacia in the left posterior temporal lobe. Vascular: No hyperdense vessels. Scattered calcifications at the carotid siphons. Skull: No fracture Sinuses/Orbits: Mild mucosal thickening in the ethmoid sinuses. No acute orbital abnormality Other: None IMPRESSION: 1. No definite CT evidence for acute intracranial abnormality. 2. Left frontal ventricular catheter with adjacent encephalomalacia in the frontal lobe. Slight asymmetric appearance  of ventricles, right larger than left but without hydrocephalus. Electronically Signed   By: Donavan Foil M.D.   On: 04/04/2018 23:53   Dg Chest Portable 1 View  Result Date: 03/26/2018 CLINICAL DATA:  Post code EXAM: PORTABLE CHEST 1 VIEW COMPARISON:  None. FINDINGS: Endotracheal tube tip is about 6.1 cm superior to carina. Esophageal tube tip projects over the distal esophagus. Right IJ central venous catheter tip over the SVC. No pneumothorax. No focal opacity or pleural effusion. Mild cardiomegaly. Probable left-sided shunt catheter. IMPRESSION: 1. Endotracheal tube tip about 6.1 cm superior to carina 2. Esophageal tube tip overlies the  distal esophagus, further advancement recommended for more optimal positioning. 3. Mild cardiomegaly without acute infiltrate 4. Right IJ central venous catheter tip over the SVC without pneumothorax. Electronically Signed   By: Donavan Foil M.D.   On: 03/17/2018 22:23   Dg Abd Portable 1 View  Result Date: 04/04/2018 CLINICAL DATA:  OG tube placement EXAM: PORTABLE ABDOMEN - 1 VIEW COMPARISON:  None. FINDINGS: The tip of the esophageal tube may be visible near the distal esophagus. Mild gaseous enlargement of stomach. Left-sided shunt tubing courses toward the pelvis but is incompletely imaged. Nonobstructed gas pattern. IMPRESSION: Esophageal tube tip appears to overlie the distal esophagus. Further advancement recommended for more optimal positioning Electronically Signed   By: Donavan Foil M.D.   On: 03/26/2018 22:24     ASSESSMENT AND PLAN: Status post cardiac arrest with prolonged resuscitation efforts estimated at 40 minutes. Currently intubated and sedated on dopamine drip, unable to wean dopamine drip at this time. Heart rate is stable NSR 70bpm, but with frequent ectopy.  Due to hemodynamic instability and acute vs chronic renal failure, will defer cardiac cath at this time but will reconsider if patient is able to wean off vasopressors. Echocardiogram is pending as well as neurology consult.  Jake Bathe, NP-C Cell: [phone]

## 2018-04-07 NOTE — Procedures (Signed)
Arterial Catheter Insertion Procedure Note Brian Boyle [id number] [birth year]  Procedure: Insertion of Arterial Catheter  Indications: Blood pressure monitoring and Frequent blood sampling  Procedure Details Consent: Emergent Time Out: Verified patient identification, verified procedure, site/side was marked, verified correct patient position, special equipment/implants available, medications/allergies/relevent history reviewed, required imaging and test results available.  Performed  Maximum sterile technique was used including antiseptics, cap, gloves, gown, hand hygiene, mask and sheet. Skin prep: Chlorhexidine; local anesthetic administered 20 gauge catheter was inserted into right femoral artery using the Seldinger technique. ULTRASOUND GUIDANCE USED: YES Evaluation Blood flow good; BP tracing good. Complications: No apparent complications.  Right femoral arterial line placed utilizing ultrasound no complications noted during or following procedure.  Sonda Rumble, AGNP  Pulmonary/Critical Care Pager [phone] (please enter 7 digits) PCCM Consult Pager [phone] (please enter 7 digits)

## 2018-04-07 NOTE — Consult Note (Signed)
Reason for Consult:Unresponsive Referring Physician: Vianne Bulls  CC: Unresponsive s/p arrest  HPI: Brian Boyle is an 62 y.o. male who is unable to provide history due to being intubated and sedated.  Family not available at this time therefore all history obtained from the chart.  Patient with a PMH of HIV, Chronic Systolic CHF, Non-sustained Ventricular Tachycardia, Cardiomyopathy, Left Ventricular Mural Thrombus without MI, Anxiety, Depressive Disorder, Cryptococcal Meningoencephalitis, and Melanoma who presented to [facility] ER via EMS on 04/30 s/p witnessed cardiac arrest.  Per ER notes pt c/o lightheadedness and weakness then proceeded to the gym.  He returned home and became unresponsive foaming at the mouth, therefore pts mother notified EMS. EMS reported upon their arrival the pt was unresponsive in cardiac arrest.  Initial cardiac rhythm was ventricular tachycardia vs. torsades. EMS administered 2g of magnesium, 2-3 rounds of epinephrine, sodium bicarbonate, calcium, narcan, and pt required defibrillation multiple times (estimated 11 times) with ROSC estimated after a total downtime of 40 minutes.  Following ROSC pt hypotensive and mechanically intubated in the field.  In the ER ET tube found to be dislodged, therefore patient required reintubation.  Patient noted to have myoclonic activity overnight.  Remains sedated and Keppra initiated.    Past Medical History:  Diagnosis Date  . HIV (human immunodeficiency virus infection) ([place])     History reviewed. No pertinent surgical history.  Family history: Unable to obtain secondary to being unresponsive  Social History:  has no tobacco, alcohol, and drug history on file.  No Known Allergies  Medications:  I have reviewed the patient's current medications. Prior to Admission:  No medications prior to admission.   Scheduled: . aspirin  324 mg Oral Once  . chlorhexidine gluconate (MEDLINE KIT)  15 mL Mouth Rinse BID  . docusate sodium  100  mg Oral BID  . hydrocortisone sod succinate (SOLU-CORTEF) inj  100 mg Intravenous Q8H  . mouth rinse  15 mL Mouth Rinse 10 times per day    ROS: Unable to provide due to intubation and sedation  Physical Examination: Blood pressure 113/88, pulse 71, temperature (!) 96.6 F (35.9 C), temperature source Bladder, resp. rate 14, height 5' 9"  (1.753 m), weight 65.3 kg (143 lb 15.4 oz), SpO2 94 %.  HEENT-  Normocephalic, no lesions, without obvious abnormality.  Normal external eye and conjunctiva.  Normal TM's bilaterally.  Normal auditory canals and external ears. Normal external nose, mucus membranes and septum.  Normal pharynx. Cardiovascular- S1, S2 normal, pulses palpable throughout   Lungs- shallow breath sounds bilaterally Abdomen- soft, non-tender; bowel sounds normal; no masses,  no organomegaly Extremities- no edema Lymph-no adenopathy palpable Musculoskeletal-no joint tenderness, deformity or swelling Skin-warm and dry, no hyperpigmentation, vitiligo, or suspicious lesions  Neurological Examination   Mental Status: Patient does not respond to verbal stimuli.  Does not respond to deep sternal rub.  Does not follow commands.  No verbalizations noted.  Cranial Nerves: II: patient does not respond confrontation bilaterally, left pupil slightly larger than right with both minimally reactive.   III,IV,VI: doll's response absent bilaterally.  V,VII: corneal reflex absent bilaterally  VIII: patient does not respond to verbal stimuli IX,X: gag reflex reduced, XI: trapezius strength unable to test bilaterally XII: tongue strength unable to test Motor: Extremities flaccid throughout.  No spontaneous movement noted.  No purposeful movements noted. Sensory: Does not respond to noxious stimuli in any extremity. Deep Tendon Reflexes:  1+ throughout with absent AJ's bilaterally Plantars: Mute bilaterally Cerebellar: Unable to perform  Laboratory Studies:   Basic Metabolic  Panel: Recent Labs  Lab 03/21/2018 2146 04/07/18 0038 04/07/18 0353 04/07/18 0850  NA 144  --  147* 143  K 5.0  --  4.2 4.3  CL 105  --  110 104  CO2 15*  --  16* 19*  GLUCOSE 89  --  186* 257*  BUN 29*  --  31* 37*  CREATININE 2.71*  --  2.63* 3.11*  CALCIUM 9.0  --  7.2* 6.8*  MG  --  3.1*  --  2.5*  PHOS  --   --   --  6.5*    Liver Function Tests: Recent Labs  Lab 03/26/2018 2146  AST 972*  ALT 944*  ALKPHOS 81  BILITOT 1.4*  PROT 6.2*  ALBUMIN 3.7   No results for input(s): LIPASE, AMYLASE in the last 168 hours. No results for input(s): AMMONIA in the last 168 hours.  CBC: Recent Labs  Lab 03/18/2018 2146 04/07/18 0353  WBC 15.4* 21.5*  NEUTROABS 13.6*  --   HGB 14.5 15.3  HCT 44.1 44.9  MCV 106.7* 106.6*  PLT 143* 117*    Cardiac Enzymes: Recent Labs  Lab 03/10/2018 2146 04/07/18 0353 04/07/18 0850  CKTOTAL  --   --  23,390*  TROPONINI 17.60* >65.00* >65.00*    BNP: Invalid input(s): POCBNP  CBG: Recent Labs  Lab 03/18/2018 2345 04/07/18 0311  GLUCAP 39 118*    Microbiology: Results for orders placed or performed during the hospital encounter of 03/08/2018  MRSA PCR Screening     Status: None   Collection Time: 03/18/2018 11:45 PM  Result Value Ref Range Status   MRSA by PCR NEGATIVE NEGATIVE Final    Comment:        The GeneXpert MRSA Assay (FDA approved for NASAL specimens only), is one component of a comprehensive MRSA colonization surveillance program. It is not intended to diagnose MRSA infection nor to guide or monitor treatment for MRSA infections. Performed at [facility], [address] [place], [place] [address]   CULTURE, BLOOD (ROUTINE X 2) w Reflex to ID Panel     Status: None (Preliminary result)   Collection Time: 04/07/18  3:54 AM  Result Value Ref Range Status   Specimen Description BLOOD LEFT ANTECUBITAL  Final   Special Requests   Final    BOTTLES DRAWN AEROBIC AND ANAEROBIC Blood Culture adequate volume    Culture   Final    NO GROWTH < 12 HOURS Performed at [facility], [address], [place], [place] [address]    Report Status PENDING  Incomplete  CULTURE, BLOOD (ROUTINE X 2) w Reflex to ID Panel     Status: None (Preliminary result)   Collection Time: 04/07/18  4:05 AM  Result Value Ref Range Status   Specimen Description BLOOD LEFT WRIST  Final   Special Requests   Final    BOTTLES DRAWN AEROBIC AND ANAEROBIC Blood Culture adequate volume   Culture   Final    NO GROWTH < 12 HOURS Performed at [facility], [address], [place], [place] [address]    Report Status PENDING  Incomplete  Culture, respiratory (NON-Expectorated)     Status: None (Preliminary result)   Collection Time: 04/07/18  5:36 AM  Result Value Ref Range Status   Specimen Description   Final    TRACHEAL ASPIRATE Performed at [facility], [address] [place], [place] [address]    Special Requests  Final    NONE Performed at [facility], [place], [place] [address]    Gram Stain   Final    FEW WBC PRESENT, PREDOMINANTLY PMN FEW GRAM POSITIVE COCCI RARE GRAM NEGATIVE RODS Performed at [facility], [place] [address] [place], [place] [address]    Culture PENDING  Incomplete   Report Status PENDING  Incomplete    Coagulation Studies: Recent Labs    03/25/2018 Feb 13, 2145  LABPROT 20.6*  INR 1.79    Urinalysis:  Recent Labs  Lab 04/07/18 0040  COLORURINE AMBER*  LABSPEC 1.016  PHURINE 6.0  GLUCOSEU NEGATIVE  HGBUR LARGE*  BILIRUBINUR NEGATIVE  KETONESUR NEGATIVE  PROTEINUR 100*  NITRITE NEGATIVE  LEUKOCYTESUR NEGATIVE    Lipid Panel:     Component Value Date/Time   CHOL 199 04/04/2018 2145/02/13   TRIG 75 03/12/2018 13-Feb-2145   HDL 68 04/05/2018 02-13-2145   CHOLHDL 2.9 03/19/2018 2145-02-13   VLDL 15 03/28/2018 2145-02-13   LDLCALC 116 (H) 03/21/2018 Feb 13, 2145    HgbA1C: No results found for: HGBA1C  Urine Drug Screen:      Component Value Date/Time    LABOPIA NONE DETECTED 04/07/2018 0040   COCAINSCRNUR NONE DETECTED 04/07/2018 0040   LABBENZ NONE DETECTED 04/07/2018 0040   AMPHETMU NONE DETECTED 04/07/2018 0040   THCU NONE DETECTED 04/07/2018 0040   LABBARB NONE DETECTED 04/07/2018 0040    Alcohol Level: No results for input(s): ETH in the last 168 hours.  Other results: EKG: 61 bpm with first degree AV block and occasional PVC's.  Imaging: Dg Chest 1 View  Result Date: 04/07/2018 CLINICAL DATA:  Endotracheal tube placement EXAM: CHEST  1 VIEW COMPARISON:  03/18/2018 FINDINGS: Endotracheal tube has been placed with tip measuring 3.4 cm above the carina. Right central venous catheter with tip over the mid SVC region. No pneumothorax. Enteric tube tip is off the field of view but below the left hemidiaphragm. Tubing over the left chest consistent with ventricular peritoneal shunt tubing. Cardiac enlargement with mild pulmonary vascular congestion. No consolidation or edema. No blunting of costophrenic angles. Mediastinal contours appear intact. IMPRESSION: Appliances appear in satisfactory position. Cardiac enlargement. Mild pulmonary vascular congestion. No edema or consolidation. Electronically Signed   By: Lucienne Capers M.D.   On: 04/07/2018 00:31   Dg Abd 1 View  Result Date: 04/07/2018 CLINICAL DATA:  OG tube placement EXAM: ABDOMEN - 1 VIEW COMPARISON:  04/04/2018 FINDINGS: Enteric tube tip is in the left mid abdomen consistent with location in the distal stomach. Visualized colon is not abnormally distended. Ventricular peritoneal shunt tubing is present. IMPRESSION: Enteric tube tip is in the left mid abdomen consistent with location in the distal stomach. Electronically Signed   By: Lucienne Capers M.D.   On: 04/07/2018 00:32   Ct Head Wo Contrast  Result Date: 04/04/2018 CLINICAL DATA:  Altered LOC, cardiac arrest EXAM: CT HEAD WITHOUT CONTRAST TECHNIQUE: Contiguous axial images were obtained from the base of the skull through  the vertex without intravenous contrast. COMPARISON:  None. FINDINGS: Brain: No acute territorial infarction or hemorrhage is visualized. Mild encephalomalacia in the left frontal lobe adjacent to ventricular catheter. Left frontal catheter terminates at the midline posteriorly, just above the pineal region. Slight asymmetric appearance of the ventricles, right larger than left but no ventricular enlargement. Possible small focus of encephalomalacia in the left posterior temporal lobe. Vascular: No hyperdense vessels. Scattered calcifications at the carotid siphons. Skull: No fracture Sinuses/Orbits: Mild mucosal thickening in  the ethmoid sinuses. No acute orbital abnormality Other: None IMPRESSION: 1. No definite CT evidence for acute intracranial abnormality. 2. Left frontal ventricular catheter with adjacent encephalomalacia in the frontal lobe. Slight asymmetric appearance of ventricles, right larger than left but without hydrocephalus. Electronically Signed   By: Donavan Foil M.D.   On: 03/27/2018 23:53   Dg Chest Portable 1 View  Result Date: 04/04/2018 CLINICAL DATA:  Post code EXAM: PORTABLE CHEST 1 VIEW COMPARISON:  None. FINDINGS: Endotracheal tube tip is about 6.1 cm superior to carina. Esophageal tube tip projects over the distal esophagus. Right IJ central venous catheter tip over the SVC. No pneumothorax. No focal opacity or pleural effusion. Mild cardiomegaly. Probable left-sided shunt catheter. IMPRESSION: 1. Endotracheal tube tip about 6.1 cm superior to carina 2. Esophageal tube tip overlies the distal esophagus, further advancement recommended for more optimal positioning. 3. Mild cardiomegaly without acute infiltrate 4. Right IJ central venous catheter tip over the SVC without pneumothorax. Electronically Signed   By: Donavan Foil M.D.   On: 03/21/2018 22:23   Dg Abd Portable 1 View  Result Date: 03/30/2018 CLINICAL DATA:  OG tube placement EXAM: PORTABLE ABDOMEN - 1 VIEW COMPARISON:   None. FINDINGS: The tip of the esophageal tube may be visible near the distal esophagus. Mild gaseous enlargement of stomach. Left-sided shunt tubing courses toward the pelvis but is incompletely imaged. Nonobstructed gas pattern. IMPRESSION: Esophageal tube tip appears to overlie the distal esophagus. Further advancement recommended for more optimal positioning Electronically Signed   By: Donavan Foil M.D.   On: 03/18/2018 22:24     Assessment/Plan: 62 year old male with multiple medical problems s/p cardiac arrest with estimated 40 minute downtime now intubated and sedated.  Patient noted to have myoclonic activity clinically that is improved since increase in Versed and start of Keppra.  EEG pending.  Initial head CT reviewed and shows no acute changes.    Recommendations: 1. Will follow up EEG 2. After completion of normothermia protocol will likely need repeat of head CT if patient not improved clinically.   3. Would continue Keppra at current dose.    Alexis Goodell, MD Neurology [phone] 04/07/2018, 11:58 AM

## 2018-04-07 NOTE — Progress Notes (Signed)
ANTICOAGULATION CONSULT NOTE  Pharmacy Consult for heparin drip Indication: chest pain/ACS  No Known Allergies  Patient Measurements: Height:  (175.3 cm) Weight: 143 lb 15.4 oz (65.3 kg)(Patient on artic sun) IBW/kg (Calculated) : 70.7 Heparin Dosing Weight: 58 kg  Vital Signs:  Temp: 97.2 F (36.2 C) (05/01 1200) Temp Source: Bladder (05/01 1200) BP: 114/78 (05/01 1200) Pulse Rate: 71 (05/01 1200)  Labs: Recent Labs    03/11/2018 2146 04/07/18 0108 04/07/18 0353 04/07/18 0850  HGB 14.5  --  15.3  --   HCT 44.1  --  44.9  --   PLT 143*  --  117*  --   APTT 39*  --   --  114*  LABPROT 20.6*  --   --   --   INR 1.79  --   --   --   HEPARINUNFRC  --  1.07*  --  1.05*  CREATININE 2.71*  --  2.63* 3.11*  CKTOTAL  --   --   --  23,390*  TROPONINI 17.60*  --  >65.00* >65.00*    Estimated Creatinine Clearance: 23.6 mL/min (A) (by C-G formula based on SCr of 3.11 mg/dL (H)).   Medical History: Past Medical History:  Diagnosis Date  . HIV (human immunodeficiency virus infection) ([facility])     Medications:  On Eliquis PTA   Assessment: 62 y/o M with a h/o mural thrombus admitted to ICU on TTM protocol after cardiac arrest.   Goal of Therapy:  Heparin level 0.3-0.7 units/ml  APTT 66-102 Monitor platelets by anticoagulation protocol: Yes   Plan:  Will decrease heparin infusion to 600 units/hr and recheck aPTT in 6 hours.   Luisa Hart D 04/07/2018,1:25 PM

## 2018-04-07 NOTE — Progress Notes (Signed)
Spoke with pts mother Jerrol Banana via telephone to inform her pts condition continues to decline despite aggressive treatment.  I discussed code status with her and she states her son has never told her what his wishes were.  Therefore, she would like for him to remain a FULL CODE at this time.  I instructed her to come back to the hospital due to high likelihood her son will cardiac arrest again.   Sonda Rumble, AGNP  Pulmonary/Critical Care Pager [phone] (please enter 7 digits) PCCM Consult Pager [phone] (please enter 7 digits)

## 2018-04-07 NOTE — Progress Notes (Signed)
ANTICOAGULATION CONSULT NOTE  Pharmacy Consult for heparin drip Indication: chest pain/ACS  No Known Allergies  Patient Measurements: Height:  (175.3 cm) Weight: 143 lb 15.4 oz (65.3 kg)(Patient on artic sun) IBW/kg (Calculated) : 70.7 Heparin Dosing Weight: 58 kg  Vital Signs:  Temp: 97 F (36.1 C) (05/01 1900) Temp Source: Bladder (05/01 1900) BP: 108/67 (05/01 1900) Pulse Rate: 74 (05/01 1900)  Labs: Recent Labs    03/14/2018 2146 04/07/18 0108 04/07/18 0353 04/07/18 0850 04/07/18 1545 04/07/18 1551 04/07/18 1810  HGB 14.5  --  15.3  --   --   --   --   HCT 44.1  --  44.9  --   --   --   --   PLT 143*  --  117*  --   --   --   --   APTT 39*  --   --  114*  --   --  113*  LABPROT 20.6*  --   --   --   --   --   --   INR 1.79  --   --   --   --   --   --   HEPARINUNFRC  --  1.07*  --  1.05*  --   --   --   CREATININE 2.71*  --  2.63* 3.11*  --  3.18*  --   CKTOTAL  --   --   --  23,390*  --   --   --   TROPONINI 17.60*  --  >65.00* >65.00* >65.00*  --   --     Estimated Creatinine Clearance: 23.1 mL/min (A) (by C-G formula based on SCr of 3.18 mg/dL (H)).   Medical History: Past Medical History:  Diagnosis Date  . HIV (human immunodeficiency virus infection) ([facility])     Medications:  On Eliquis PTA   Assessment: 62 y/o M with a h/o mural thrombus admitted to ICU on TTM protocol after cardiac arrest.   Goal of Therapy:  Heparin level 0.3-0.7 units/ml  APTT 66-102 Monitor platelets by anticoagulation protocol: Yes   Plan:  5/1 1810 aPTT 113. Level is supratherapeutic. Will decrease infusion to  500u/hr and recheck aPTT in 6 hours. CBC with AM labs per protocol.   Gardner Candle, PharmD, BCPS Clinical Pharmacist 04/07/2018 7:25 PM

## 2018-04-07 NOTE — Care Management (Signed)
Patient is listed as uninsured and no PCP.  He may benefit from referral to infectious disease for management in outpatient world through [facility].

## 2018-04-07 NOTE — Progress Notes (Signed)
*  PRELIMINARY RESULTS* Echocardiogram 2D Echocardiogram has been performed.  Brian Boyle M Azelie Noguera 04/07/2018, 3:03 PM 

## 2018-04-07 DEATH — deceased

## 2018-04-08 ENCOUNTER — Encounter: Payer: Self-pay | Admitting: *Deleted

## 2018-04-08 ENCOUNTER — Other Ambulatory Visit: Payer: Self-pay

## 2018-04-08 ENCOUNTER — Inpatient Hospital Stay: Payer: Medicare HMO

## 2018-04-08 DIAGNOSIS — B20 Human immunodeficiency virus [HIV] disease: Secondary | ICD-10-CM

## 2018-04-08 DIAGNOSIS — J9601 Acute respiratory failure with hypoxia: Secondary | ICD-10-CM

## 2018-04-08 DIAGNOSIS — G9349 Other encephalopathy: Secondary | ICD-10-CM

## 2018-04-08 LAB — PHOSPHORUS
PHOSPHORUS: 4.6 mg/dL (ref 2.5–4.6)
PHOSPHORUS: 4.3 mg/dL (ref 2.5–4.6)
Phosphorus: 5.5 mg/dL — ABNORMAL HIGH (ref 2.5–4.6)

## 2018-04-08 LAB — BLOOD GAS, ARTERIAL
ACID-BASE EXCESS: 8.2 mmol/L — AB (ref 0.0–2.0)
ACID-BASE EXCESS: 8.2 mmol/L — AB (ref 0.0–2.0)
BICARBONATE: 32.8 mmol/L — AB (ref 20.0–28.0)
Bicarbonate: 32.8 mmol/L — ABNORMAL HIGH (ref 20.0–28.0)
FIO2: 1
FIO2: 100
MECHVT: 500 mL
MECHVT: 500 mL
O2 SAT: 98.7 %
O2 SAT: 99.9 %
PATIENT TEMPERATURE: 37
PATIENT TEMPERATURE: 37
PEEP/CPAP: 5 cmH2O
PEEP/CPAP: 5 cmH2O
PH ART: 7.48 — AB (ref 7.350–7.450)
PO2 ART: 112 mmHg — AB (ref 83.0–108.0)
PO2 ART: 252 mmHg — AB (ref 83.0–108.0)
RATE: 20 resp/min
RATE: 20 resp/min
pCO2 arterial: 44 mmHg (ref 32.0–48.0)
pCO2 arterial: 44 mmHg (ref 32.0–48.0)
pH, Arterial: 7.48 — ABNORMAL HIGH (ref 7.350–7.450)

## 2018-04-08 LAB — GLUCOSE, CAPILLARY
GLUCOSE-CAPILLARY: 107 mg/dL — AB (ref 65–99)
GLUCOSE-CAPILLARY: 99 mg/dL (ref 65–99)
GLUCOSE-CAPILLARY: 79 mg/dL (ref 65–99)
Glucose-Capillary: 63 mg/dL — ABNORMAL LOW (ref 65–99)
Glucose-Capillary: 106 mg/dL — ABNORMAL HIGH (ref 65–99)
Glucose-Capillary: 102 mg/dL — ABNORMAL HIGH (ref 65–99)
Glucose-Capillary: 46 mg/dL — ABNORMAL LOW (ref 65–99)
Glucose-Capillary: 96 mg/dL (ref 65–99)
Glucose-Capillary: 49 mg/dL — ABNORMAL LOW (ref 65–99)
Glucose-Capillary: 49 mg/dL — ABNORMAL LOW (ref 65–99)
Glucose-Capillary: 94 mg/dL (ref 65–99)
Glucose-Capillary: 83 mg/dL (ref 65–99)
Glucose-Capillary: 74 mg/dL (ref 65–99)

## 2018-04-08 LAB — HEPARIN LEVEL (UNFRACTIONATED): Heparin Unfractionated: 0.8 IU/mL — ABNORMAL HIGH (ref 0.30–0.70)

## 2018-04-08 LAB — BASIC METABOLIC PANEL
ANION GAP: 13 (ref 5–15)
Anion gap: 13 (ref 5–15)
Anion gap: 14 (ref 5–15)
BUN: 49 mg/dL — ABNORMAL HIGH (ref 6–20)
BUN: 57 mg/dL — AB (ref 6–20)
BUN: 65 mg/dL — AB (ref 6–20)
CALCIUM: 6.3 mg/dL — AB (ref 8.9–10.3)
CALCIUM: 6.5 mg/dL — AB (ref 8.9–10.3)
CHLORIDE: 95 mmol/L — AB (ref 101–111)
CHLORIDE: 98 mmol/L — AB (ref 101–111)
CO2: 29 mmol/L (ref 22–32)
CO2: 30 mmol/L (ref 22–32)
CO2: 35 mmol/L — AB (ref 22–32)
CREATININE: 4.24 mg/dL — AB (ref 0.61–1.24)
Calcium: 6.3 mg/dL — CL (ref 8.9–10.3)
Chloride: 94 mmol/L — ABNORMAL LOW (ref 101–111)
Creatinine, Ser: 3.74 mg/dL — ABNORMAL HIGH (ref 0.61–1.24)
Creatinine, Ser: 4.83 mg/dL — ABNORMAL HIGH (ref 0.61–1.24)
GFR calc Af Amer: 16 mL/min — ABNORMAL LOW (ref >60)
GFR calc Af Amer: 14 mL/min — ABNORMAL LOW (ref >60)
GFR calc non Af Amer: 16 mL/min — ABNORMAL LOW (ref >60)
GFR calc non Af Amer: 14 mL/min — ABNORMAL LOW (ref >60)
GFR calc non Af Amer: 12 mL/min — ABNORMAL LOW (ref >60)
GFR, EST AFRICAN AMERICAN: 19 mL/min — AB (ref >60)
GLUCOSE: 108 mg/dL — AB (ref 65–99)
GLUCOSE: 115 mg/dL — AB (ref 65–99)
Glucose, Bld: 183 mg/dL — ABNORMAL HIGH (ref 65–99)
POTASSIUM: 6 mmol/L — AB (ref 3.5–5.1)
Potassium: 5.8 mmol/L — ABNORMAL HIGH (ref 3.5–5.1)
Potassium: 4.9 mmol/L (ref 3.5–5.1)
SODIUM: 140 mmol/L (ref 135–145)
Sodium: 142 mmol/L (ref 135–145)
Sodium: 139 mmol/L (ref 135–145)

## 2018-04-08 LAB — CBC
HEMATOCRIT: 41 % (ref 40.0–52.0)
Hemoglobin: 14 g/dL (ref 13.0–18.0)
MCH: 35 pg — AB (ref 26.0–34.0)
MCHC: 34 g/dL (ref 32.0–36.0)
MCV: 102.9 fL — ABNORMAL HIGH (ref 80.0–100.0)
PLATELETS: 69 10*3/uL — AB (ref 150–440)
RBC: 3.99 MIL/uL — ABNORMAL LOW (ref 4.40–5.90)
RDW: 12.9 % (ref 11.5–14.5)
WBC: 24.1 10*3/uL — ABNORMAL HIGH (ref 3.8–10.6)

## 2018-04-08 LAB — TROPONIN I

## 2018-04-08 LAB — HEPATIC FUNCTION PANEL
ALK PHOS: 55 U/L (ref 38–126)
ALT: 1860 U/L — AB (ref 17–63)
AST: 2546 U/L — ABNORMAL HIGH (ref 15–41)
Albumin: 2.6 g/dL — ABNORMAL LOW (ref 3.5–5.0)
BILIRUBIN TOTAL: 1.1 mg/dL (ref 0.3–1.2)
Bilirubin, Direct: 0.3 mg/dL (ref 0.1–0.5)
Indirect Bilirubin: 0.8 mg/dL (ref 0.3–0.9)
Total Protein: 4.5 g/dL — ABNORMAL LOW (ref 6.5–8.1)

## 2018-04-08 LAB — CALCIUM, IONIZED
CALCIUM, IONIZED, SERUM: 3.5 mg/dL — AB (ref 4.5–5.6)
Calcium, Ionized, Serum: 3.5 mg/dL — ABNORMAL LOW (ref 4.5–5.6)

## 2018-04-08 LAB — MAGNESIUM
MAGNESIUM: 2.1 mg/dL (ref 1.7–2.4)
Magnesium: 1.9 mg/dL (ref 1.7–2.4)
Magnesium: 2 mg/dL (ref 1.7–2.4)

## 2018-04-08 LAB — LACTIC ACID, PLASMA
Lactic Acid, Venous: 2.9 mmol/L (ref 0.5–1.9)
Lactic Acid, Venous: 2.9 mmol/L (ref 0.5–1.9)

## 2018-04-08 LAB — ECHOCARDIOGRAM COMPLETE
HEIGHTINCHES: 69 in
WEIGHTICAEL: 2303.37 [oz_av]

## 2018-04-08 LAB — APTT: aPTT: 107 seconds — ABNORMAL HIGH (ref 24–36)

## 2018-04-08 LAB — URINE CULTURE: Culture: NO GROWTH

## 2018-04-08 LAB — PROCALCITONIN: Procalcitonin: 17.09 ng/mL

## 2018-04-08 MED ORDER — VITAL HIGH PROTEIN PO LIQD
1000.0000 mL | ORAL | Status: DC
  Administered 2018-04-08: 1000 mL

## 2018-04-08 MED ORDER — DEXTROSE 50 % IV SOLN
1.0000 | Freq: Once | INTRAVENOUS | Status: AC
  Administered 2018-04-08: 50 mL via INTRAVENOUS
  Filled 2018-04-08: qty 50

## 2018-04-08 MED ORDER — SODIUM CHLORIDE 0.9 % IV SOLN
1.0000 g | Freq: Once | INTRAVENOUS | Status: AC
  Administered 2018-04-08: 1 g via INTRAVENOUS
  Filled 2018-04-08: qty 10

## 2018-04-08 MED ORDER — INSULIN REGULAR HUMAN 100 UNIT/ML IJ SOLN
10.0000 [IU] | Freq: Once | INTRAMUSCULAR | Status: AC
  Administered 2018-04-08: 10 [IU] via INTRAVENOUS
  Filled 2018-04-08: qty 0.1

## 2018-04-08 MED ORDER — SODIUM BICARBONATE 8.4 % IV SOLN
50.0000 meq | Freq: Once | INTRAVENOUS | Status: AC
  Administered 2018-04-08: 50 meq via INTRAVENOUS
  Filled 2018-04-08: qty 50

## 2018-04-08 MED ORDER — DEXTROSE 50 % IV SOLN
25.0000 mL | Freq: Once | INTRAVENOUS | Status: AC
  Administered 2018-04-08: 25 mL via INTRAVENOUS
  Filled 2018-04-08: qty 50

## 2018-04-08 MED ORDER — SODIUM CHLORIDE 0.9 % IV SOLN
1500.0000 mg | Freq: Once | INTRAVENOUS | Status: AC
Start: 2018-04-08 — End: 2018-04-08
  Administered 2018-04-08: 1500 mg via INTRAVENOUS
  Filled 2018-04-08: qty 1500

## 2018-04-08 MED ORDER — SODIUM BICARBONATE 8.4 % IV SOLN
INTRAVENOUS | Status: DC
  Administered 2018-04-08: 06:00:00 via INTRAVENOUS
  Filled 2018-04-08 (×2): qty 150

## 2018-04-08 MED ORDER — DEXTROSE 50 % IV SOLN
50.0000 mL | Freq: Once | INTRAVENOUS | Status: AC
  Administered 2018-04-08: 50 mL via INTRAVENOUS
  Filled 2018-04-08: qty 50

## 2018-04-08 MED ORDER — DEXTROSE-NACL 5-0.9 % IV SOLN
INTRAVENOUS | Status: DC
  Administered 2018-04-08 – 2018-04-11 (×5): via INTRAVENOUS

## 2018-04-08 MED ORDER — HYDROCORTISONE NA SUCCINATE PF 100 MG IJ SOLR
50.0000 mg | Freq: Three times a day (TID) | INTRAMUSCULAR | Status: DC
  Administered 2018-04-08 – 2018-04-09 (×3): 50 mg via INTRAVENOUS
  Filled 2018-04-08 (×3): qty 2

## 2018-04-08 MED ORDER — INSULIN ASPART 100 UNIT/ML IV SOLN
10.0000 [IU] | Freq: Once | INTRAVENOUS | Status: AC
  Administered 2018-04-08: 10 [IU] via INTRAVENOUS
  Filled 2018-04-08 (×2): qty 0.1

## 2018-04-08 MED ORDER — SODIUM CHLORIDE 0.9 % IV SOLN: INTRAVENOUS | Status: DC

## 2018-04-08 MED ORDER — DEXTROSE 50 % IV SOLN
100.0000 mL | Freq: Once | INTRAVENOUS | Status: AC
  Administered 2018-04-08: 100 mL via INTRAVENOUS
  Filled 2018-04-08: qty 100

## 2018-04-08 NOTE — Progress Notes (Signed)
See flow record for assessment.. Currently pt off dopamine gtt. HR 70-80 with BBB. Less ectopy today.  Levophed gtt at 31mcg/min. Midazolam continues at 4 mg/hr. Fentanyl, heparin and bicarb gtts DC'd today. Myoclonic jerks with stimulation. And possibly some extension with bath and linen change. pO2 much improved on last ABG. FIO2 was decreased to 60% by RT.  Pt maintains good sats 99-100%. Only visitor was male friend that runs gym where pt works out. Pt mother is elderly and has not been in to see and has not called today. Pt seen by CCM, Cardiologist, nephrologist, neurologist , and  Dr Sampson Goon (ID)

## 2018-04-08 NOTE — Consult Note (Signed)
[facility]ious [facility]     Reason for Consult:HIV, abnl LFTs    Referring Physician: Dr. Soyla Murphy Date of Admission:  03/15/2018   Active Problems:   Cardiac arrest [facility])   Myoclonus   Malnutrition of moderate degree   HPI: Brian Boyle is a 62 y.o. male with well controlled HIV, last CD and VL at [place] and < 20 in Nov 2018 on [place] who had out of hospital cardiac arrest and was admitted to the ICU after ROSC after 40 minutes of down time. He is currently intubated and in critical condition with ARF, shock liver.   Past Medical History:  Diagnosis Date  . HIV (human immunodeficiency virus infection) ([place])    History reviewed. No pertinent surgical history. Social History   Tobacco Use  . Smoking status: Not on file  Substance Use Topics  . Alcohol use: Not on file  . Drug use: Not on file   No family history on file.  Allergies: No Known Allergies  Current antibiotics: Antibiotics Given (last 72 hours)    Date/Time Action Medication Dose Rate   04/07/18 0259 New Bag/Given   cefTRIAXone (ROCEPHIN) 2 g in sodium chloride 0.9 % 100 mL IVPB 2 g 200 mL/hr   04/07/18 0507 New Bag/Given   azithromycin (ZITHROMAX) 500 mg in sodium chloride 0.9 % 250 mL IVPB 500 mg 250 mL/hr   04/07/18 1414 New Bag/Given   piperacillin-tazobactam (ZOSYN) IVPB 3.375 g 3.375 g 12.5 mL/hr   04/08/18 0141 New Bag/Given   piperacillin-tazobactam (ZOSYN) IVPB 3.375 g 3.375 g 12.5 mL/hr   04/08/18 1312 New Bag/Given   vancomycin (VANCOCIN) 1,500 mg in sodium chloride 0.9 % 500 mL IVPB 1,500 mg 250 mL/hr      MEDICATIONS: . aspirin  324 mg Oral Once  . chlorhexidine gluconate (MEDLINE KIT)  15 mL Mouth Rinse BID  . docusate sodium  100 mg Oral BID  . feeding supplement (VITAL HIGH PROTEIN)  1,000 mL Per Tube Q24H  . hydrocortisone sod succinate (SOLU-CORTEF) inj  50 mg Intravenous Q8H  . insulin aspart  0-9 Units Subcutaneous Q4H  . mouth rinse  15 mL Mouth Rinse 10 times per day     Review of Systems - 11 systems reviewed and negative per HPI   OBJECTIVE: Temp:  [96.1 F (35.6 C)-99.1 F (37.3 C)] 98.8 F (37.1 C) (05/02 1300) Pulse Rate:  [59-77] 74 (05/02 1300) Resp:  [9-23] 19 (05/02 1300) BP: (91-120)/(62-92) 96/67 (05/02 1300) SpO2:  [95 %-100 %] 100 % (05/02 1300) Arterial Line BP: (99-132)/(58-77) 116/67 (05/02 1300) FiO2 (%):  [80 %-100 %] 100 % (05/02 1222) Weight:  [68.1 kg (150 lb 2.1 oz)] 68.1 kg (150 lb 2.1 oz) (05/02 0500) Physical Exam  Constitutional: intubaed, sedated, critically ill Mouth/Throat: ett in place Cardiovascular: tachy, reg Pulmonary/Chest: mech BS Abdominal: Soft. Bowel sounds are normal. He exhibits no distension. There is no tenderness.  Lymphadenopathy: He has no cervical adenopathy.  Neurological: intubaed and sedated Skin: Skin is warm and dry. No rash noted. No erythema.  Psychiatric: unable to obtain Ext no cce   LABS: Results for orders placed or performed during the hospital encounter of 03/17/2018 (from the past 48 hour(s))  CBC with Differential/Platelet     Status: Abnormal   Collection Time: 03/12/2018  9:46 PM  Result Value Ref Range   WBC 15.4 (H) 3.8 - 10.6 K/uL   RBC 4.13 (L) 4.40 - 5.90 MIL/uL   Hemoglobin 14.5 13.0 - 18.0  g/dL   HCT 44.1 40.0 - 52.0 %   MCV 106.7 (H) 80.0 - 100.0 fL   MCH 35.2 (H) 26.0 - 34.0 pg   MCHC 33.0 32.0 - 36.0 g/dL   RDW 13.4 11.5 - 14.5 %   Platelets 143 (L) 150 - 440 K/uL   Neutrophils Relative % 88 %   Neutro Abs 13.6 (H) 1.4 - 6.5 K/uL   Lymphocytes Relative 6 %   Lymphs Abs 0.9 (L) 1.0 - 3.6 K/uL   Monocytes Relative 6 %   Monocytes Absolute 0.9 0.2 - 1.0 K/uL   Eosinophils Relative 0 %   Eosinophils Absolute 0.0 0 - 0.7 K/uL   Basophils Relative 0 %   Basophils Absolute 0.0 0 - 0.1 K/uL    Comment: Performed at [facility], [address] [place], [place] [address]  Protime-INR     Status: Abnormal   Collection Time: 04/02/2018  9:46 PM  Result  Value Ref Range   Prothrombin Time 20.6 (H) 11.4 - 15.2 seconds   INR 1.79     Comment: Performed at [facility], [address] [place], [place] [address]  APTT     Status: Abnormal   Collection Time: 04/05/2018  9:46 PM  Result Value Ref Range   aPTT 39 (H) 24 - 36 seconds    Comment:        IF BASELINE aPTT IS ELEVATED, SUGGEST PATIENT RISK ASSESSMENT BE USED TO DETERMINE APPROPRIATE ANTICOAGULANT THERAPY. Performed at [facility], [address] [place], [place] [address]   Comprehensive metabolic panel     Status: Abnormal   Collection Time: 03/30/2018  9:46 PM  Result Value Ref Range   Sodium 144 135 - 145 mmol/L    Comment: ELECTROLYTES REPEATED. MSS   Potassium 5.0 3.5 - 5.1 mmol/L    Comment: HEMOLYSIS AT THIS LEVEL MAY AFFECT RESULT   Chloride 105 101 - 111 mmol/L   CO2 15 (L) 22 - 32 mmol/L   Glucose, Bld 89 65 - 99 mg/dL   BUN 29 (H) 6 - 20 mg/dL   Creatinine, Ser 2.71 (H) 0.61 - 1.24 mg/dL   Calcium 9.0 8.9 - 10.3 mg/dL   Total Protein 6.2 (L) 6.5 - 8.1 g/dL   Albumin 3.7 3.5 - 5.0 g/dL   AST 972 (H) 15 - 41 U/L   ALT 944 (H) 17 - 63 U/L   Alkaline Phosphatase 81 38 - 126 U/L   Total Bilirubin 1.4 (H) 0.3 - 1.2 mg/dL   GFR calc non Af Amer 24 (L) >60 mL/min   GFR calc Af Amer 28 (L) >60 mL/min    Comment: (NOTE) The eGFR has been calculated using the CKD EPI equation. This calculation has not been validated in all clinical situations. eGFR's persistently <60 mL/min signify possible Chronic Kidney Disease.    Anion gap 24 (H) 5 - 15    Comment: Performed at [facility], [address] [place], [place] [address]  Troponin I     Status: Abnormal   Collection Time: 03/17/2018  9:46 PM  Result Value Ref Range   Troponin I 17.60 (HH) <0.03 ng/mL    Comment: CRITICAL RESULT CALLED TO, READ BACK BY AND VERIFIED WITH Harlene Salts RN AT 2235 03/18/2018. MSS Performed at [facility], [address], [place], [place]  [address]   Lipid panel     Status: Abnormal   Collection Time: 03/11/2018  9:46 PM  Result Value  Ref Range   Cholesterol 199 0 - 200 mg/dL   Triglycerides 75 <150 mg/dL   HDL 68 >40 mg/dL   Total CHOL/HDL Ratio 2.9 RATIO   VLDL 15 0 - 40 mg/dL   LDL Cholesterol 116 (H) 0 - 99 mg/dL    Comment:        Total Cholesterol/HDL:CHD Risk Coronary Heart Disease Risk Table                     Men   Women  1/2 Average Risk   3.4   3.3  Average Risk       5.0   4.4  2 X Average Risk   9.6   7.1  3 X Average Risk  23.4   11.0        Use the calculated Patient Ratio above and the CHD Risk Table to determine the patient's CHD Risk.        ATP III CLASSIFICATION (LDL):  <100     mg/dL   Optimal  100-129  mg/dL   Near or Above                    Optimal  130-159  mg/dL   Borderline  160-189  mg/dL   High  >190     mg/dL   Very High Performed at [facility], [address] [place], [place] [address]   Lactic acid, plasma     Status: Abnormal   Collection Time: 04/01/2018  9:47 PM  Result Value Ref Range   Lactic Acid, Venous 13.2 (HH) 0.5 - 1.9 mmol/L    Comment: CRITICAL RESULT CALLED TO, READ BACK BY AND VERIFIED WITH BARBARA THAO AT 0157 ON 04/07/18 RWW RESULT CONFIRMED BY MANUAL DILUTION/RWW Performed at [facility], [address] [place], [place] [address]   Blood gas, arterial     Status: Abnormal (Preliminary result)   Collection Time: 03/12/2018 10:19 PM  Result Value Ref Range   FIO2 1.00    Delivery systems VENTILATOR    Mode PRESSURE REGULATED VOLUME CONTROL    VT 500 mL   LHR 16 resp/min   Peep/cpap 5.0 cm H20   pH, Arterial PENDING 7.350 - 7.450   pCO2 arterial 27 (L) 32.0 - 48.0 mmHg   pO2, Arterial PENDING 83.0 - 108.0 mmHg   Bicarbonate 10.3 (L) 20.0 - 28.0 mmol/L   Acid-base deficit 16.3 (H) 0.0 - 2.0 mmol/L   O2 Saturation 99.8 %   Patient temperature 37.0    Collection site LEFT RADIAL    Sample type ARTERIAL DRAW    Allens test (pass/fail)  PASS PASS    Comment: Performed at [facility], [address] [place], [place] [address]  MRSA PCR Screening     Status: None   Collection Time: 03/15/2018 11:45 PM  Result Value Ref Range   MRSA by PCR NEGATIVE NEGATIVE    Comment:        The GeneXpert MRSA Assay (FDA approved for NASAL specimens only), is one component of a comprehensive MRSA colonization surveillance program. It is not intended to diagnose MRSA infection nor to guide or monitor treatment for MRSA infections. Performed at [facility], [address] [place], [place] [address]   Urine Culture     Status: None   Collection Time: 03/12/2018 11:45 PM  Result Value Ref Range   Specimen Description      URINE, RANDOM Performed at [facility], [address], [place], [place] [address]    Special Requests      NONE Performed at [facility], [address], [place], [place] [address]    Culture      NO GROWTH Performed at [facility], [place] [address], [place], [place] [address]    Report Status 04/08/2018 FINAL   Glucose, capillary     Status: None   Collection Time: 03/17/2018 11:45 PM  Result Value Ref Range   Glucose-Capillary 91 65 - 99 mg/dL  Magnesium     Status: Abnormal   Collection Time: 04/07/18 12:38 AM  Result Value Ref Range   Magnesium 3.1 (H) 1.7 - 2.4 mg/dL    Comment: Performed at [facility], [address] [place], [place] [address]  Procalcitonin - Baseline     Status: None   Collection Time: 04/07/18 12:38 AM  Result Value Ref Range   Procalcitonin 1.78 ng/mL    Comment:        Interpretation: PCT > 0.5 ng/mL and <= 2 ng/mL: Systemic infection (sepsis) is possible, but other conditions are known to elevate PCT as well. (NOTE)       Sepsis PCT Algorithm           Lower Respiratory Tract                                      Infection PCT Algorithm    ----------------------------     ----------------------------         PCT  < 0.25 ng/mL                PCT < 0.10 ng/mL         Strongly encourage             Strongly discourage   discontinuation of antibiotics    initiation of antibiotics    ----------------------------     -----------------------------       PCT 0.25 - 0.50 ng/mL            PCT 0.10 - 0.25 ng/mL               OR       >80% decrease in PCT            Discourage initiation of                                            antibiotics      Encourage discontinuation           of antibiotics    ----------------------------     -----------------------------         PCT >= 0.50 ng/mL              PCT 0.26 - 0.50 ng/mL                AND       <80% decrease in PCT             Encourage initiation of  antibiotics       Encourage continuation           of antibiotics    ----------------------------     -----------------------------        PCT >= 0.50 ng/mL                  PCT > 0.50 ng/mL               AND         increase in PCT                  Strongly encourage                                      initiation of antibiotics    Strongly encourage escalation           of antibiotics                                     -----------------------------                                           PCT <= 0.25 ng/mL                                                 OR                                        > 80% decrease in PCT                                     Discontinue / Do not initiate                                             antibiotics Performed at [facility], [address] [place], [place] [address]   Urinalysis, Routine w reflex microscopic     Status: Abnormal   Collection Time: 04/07/18 12:40 AM  Result Value Ref Range   Color, Urine AMBER (A) YELLOW    Comment: BIOCHEMICALS MAY BE AFFECTED BY COLOR   APPearance CLOUDY (A) CLEAR   Specific Gravity, Urine 1.016 1.005 - 1.030   pH 6.0 5.0 - 8.0   Glucose, UA NEGATIVE NEGATIVE mg/dL    Hgb urine dipstick LARGE (A) NEGATIVE   Bilirubin Urine NEGATIVE NEGATIVE   Ketones, ur NEGATIVE NEGATIVE mg/dL   Protein, ur 100 (A) NEGATIVE mg/dL   Nitrite NEGATIVE NEGATIVE   Leukocytes, UA NEGATIVE NEGATIVE   RBC / HPF >50 (H) 0 - 5 RBC/hpf   WBC, UA 6-10 0 - 5 WBC/hpf   Bacteria, UA MANY (A) NONE SEEN   Squamous Epithelial / LPF NONE SEEN 0 - 5    Comment: Please note change in reference range.  Mucus PRESENT    Hyaline Casts, UA PRESENT    Sperm, UA PRESENT    Crystals PRESENT (A) NEGATIVE    Comment: Performed at [facility], [address] [place], [place] [address]  Urine Drug Screen, Qualitative [facility] only)     Status: None   Collection Time: 04/07/18 12:40 AM  Result Value Ref Range   Tricyclic, Ur Screen NONE DETECTED NONE DETECTED   Amphetamines, Ur Screen NONE DETECTED NONE DETECTED   MDMA (Ecstasy)Ur Screen NONE DETECTED NONE DETECTED   Cocaine Metabolite,Ur [place] NONE DETECTED NONE DETECTED   Opiate, Ur Screen NONE DETECTED NONE DETECTED   Phencyclidine (PCP) Ur S NONE DETECTED NONE DETECTED   Cannabinoid 50 Ng, Ur [place] NONE DETECTED NONE DETECTED   Barbiturates, Ur Screen NONE DETECTED NONE DETECTED   Benzodiazepine, Ur Scrn NONE DETECTED NONE DETECTED   Methadone Scn, Ur NONE DETECTED NONE DETECTED    Comment: (NOTE) Tricyclics + metabolites, urine    Cutoff 1000 ng/mL Amphetamines + metabolites, urine  Cutoff 1000 ng/mL MDMA (Ecstasy), urine              Cutoff 500 ng/mL Cocaine Metabolite, urine          Cutoff 300 ng/mL Opiate + metabolites, urine        Cutoff 300 ng/mL Phencyclidine (PCP), urine         Cutoff 25 ng/mL Cannabinoid, urine                 Cutoff 50 ng/mL Barbiturates + metabolites, urine  Cutoff 200 ng/mL Benzodiazepine, urine              Cutoff 200 ng/mL Methadone, urine                   Cutoff 300 ng/mL The urine drug screen provides only a preliminary, unconfirmed analytical test result and should not be used for  non-medical purposes. Clinical consideration and professional judgment should be applied to any positive drug screen result due to possible interfering substances. A more specific alternate chemical method must be used in order to obtain a confirmed analytical result. Gas chromatography / mass spectrometry (GC/MS) is the preferred confirmat ory method. Performed at [facility], [address] [place], [place] [address]   Lactic acid, plasma     Status: Abnormal   Collection Time: 04/07/18  1:08 AM  Result Value Ref Range   Lactic Acid, Venous 12.9 (HH) 0.5 - 1.9 mmol/L    Comment: CRITICAL RESULT CALLED TO, READ BACK BY AND VERIFIED WITH BARBARA THAO AT 0221 ON 04/07/18 RWW RESULT CONFIRMED BY MANUAL DILUTION/RWW Performed at [facility], [address] [place], [place] [address]   Heparin level (unfractionated)     Status: Abnormal   Collection Time: 04/07/18  1:08 AM  Result Value Ref Range   Heparin Unfractionated 1.07 (H) 0.30 - 0.70 IU/mL    Comment:        IF HEPARIN RESULTS ARE BELOW EXPECTED VALUES, AND PATIENT DOSAGE HAS BEEN CONFIRMED, SUGGEST FOLLOW UP TESTING OF ANTITHROMBIN III LEVELS. Performed at [facility], [address] [place],  [address]   Glucose, capillary     Status: Abnormal   Collection Time: 04/07/18  3:11 AM  Result Value Ref Range   Glucose-Capillary 118 (H) 65 - 99 mg/dL  CBC     Status: Abnormal   Collection Time: 04/07/18  3:53 AM  Result Value Ref Range   WBC 21.5 (  H) 3.8 - 10.6 K/uL   RBC 4.21 (L) 4.40 - 5.90 MIL/uL   Hemoglobin 15.3 13.0 - 18.0 g/dL   HCT 44.9 40.0 - 52.0 %   MCV 106.6 (H) 80.0 - 100.0 fL   MCH 36.4 (H) 26.0 - 34.0 pg   MCHC 34.1 32.0 - 36.0 g/dL   RDW 13.6 11.5 - 14.5 %   Platelets 117 (L) 150 - 440 K/uL    Comment: Performed at [facility], [address] [place], [place] [address]  Procalcitonin     Status: None   Collection Time: 04/07/18  3:53 AM  Result  Value Ref Range   Procalcitonin 2.47 ng/mL    Comment:        Interpretation: PCT > 2 ng/mL: Systemic infection (sepsis) is likely, unless other causes are known. (NOTE)       Sepsis PCT Algorithm           Lower Respiratory Tract                                      Infection PCT Algorithm    ----------------------------     ----------------------------         PCT < 0.25 ng/mL                PCT < 0.10 ng/mL         Strongly encourage             Strongly discourage   discontinuation of antibiotics    initiation of antibiotics    ----------------------------     -----------------------------       PCT 0.25 - 0.50 ng/mL            PCT 0.10 - 0.25 ng/mL               OR       >80% decrease in PCT            Discourage initiation of                                            antibiotics      Encourage discontinuation           of antibiotics    ----------------------------     -----------------------------         PCT >= 0.50 ng/mL              PCT 0.26 - 0.50 ng/mL               AND       <80% decrease in PCT              Encourage initiation of                                             antibiotics       Encourage continuation           of antibiotics    ----------------------------     -----------------------------        PCT >= 0.50 ng/mL  PCT > 0.50 ng/mL               AND         increase in PCT                  Strongly encourage                                      initiation of antibiotics    Strongly encourage escalation           of antibiotics                                     -----------------------------                                           PCT <= 0.25 ng/mL                                                 OR                                        > 80% decrease in PCT                                     Discontinue / Do not initiate                                             antibiotics Performed at [facility], [address] [place], [place] [address]   Troponin I     Status: Abnormal   Collection Time: 04/07/18  3:53 AM  Result Value Ref Range   Troponin I >65.00 (HH) <0.03 ng/mL    Comment: CRITICAL RESULT CALLED TO, READ BACK BY AND VERIFIED WITH BARBARBA TAO 04/07/18 0535 SJL Performed at [facility], [address] [place], [place] [address]   Basic metabolic panel     Status: Abnormal   Collection Time: 04/07/18  3:53 AM  Result Value Ref Range   Sodium 147 (H) 135 - 145 mmol/L    Comment: RESULTS VERIFIED BY REPEAT TESTING/HKP   Potassium 4.2 3.5 - 5.1 mmol/L   Chloride 110 101 - 111 mmol/L   CO2 16 (L) 22 - 32 mmol/L   Glucose, Bld 186 (H) 65 - 99 mg/dL   BUN 31 (H) 6 - 20 mg/dL   Creatinine, Ser 2.63 (H) 0.61 - 1.24 mg/dL   Calcium 7.2 (L) 8.9 - 10.3 mg/dL   GFR calc non Af Amer 25 (L) >60 mL/min   GFR calc Af Amer 29 (L) >60 mL/min    Comment: (NOTE) The eGFR has been calculated using the CKD EPI equation. This calculation has not been validated in all clinical situations. eGFR's persistently <60 mL/min signify  possible Chronic Kidney Disease.    Anion gap 21 (H) 5 - 15    Comment: Performed at [facility], [address] [place], [place] [address]  CULTURE, BLOOD (ROUTINE X 2) w Reflex to ID Panel     Status: None (Preliminary result)   Collection Time: 04/07/18  3:54 AM  Result Value Ref Range   Specimen Description BLOOD LEFT ANTECUBITAL    Special Requests      BOTTLES DRAWN AEROBIC AND ANAEROBIC Blood Culture adequate volume   Culture      NO GROWTH 1 DAY Performed at [facility], [address] [place], [place] [address]    Report Status PENDING   CULTURE, BLOOD (ROUTINE X 2) w Reflex to ID Panel     Status: None (Preliminary result)   Collection Time: 04/07/18  4:05 AM  Result Value Ref Range   Specimen Description BLOOD LEFT WRIST    Special Requests      BOTTLES DRAWN AEROBIC AND ANAEROBIC Blood Culture adequate volume   Culture       NO GROWTH 1 DAY Performed at [facility], [address], [place], [place] [address]    Report Status PENDING   Lactic acid, plasma     Status: Abnormal   Collection Time: 04/07/18  4:16 AM  Result Value Ref Range   Lactic Acid, Venous 10.7 (HH) 0.5 - 1.9 mmol/L    Comment: CRITICAL RESULT CALLED TO, READ BACK BY AND VERIFIED WITH BARBARA TAO 04/07/18 0553 SJL Performed at [facility], [address] [place], [place] [address]   Arterial Blood Gas     Status: Abnormal   Collection Time: 04/07/18  5:00 AM  Result Value Ref Range   FIO2 50.00    Delivery systems VENTILATOR    Mode PRESSURE REGULATED VOLUME CONTROL    VT 500.0 mL   Peep/cpap 5.0 cm H20   pH, Arterial 7.19 (LL) 7.350 - 7.450    Comment: CRITICAL RESULT CALLED TO, READ BACK BY AND VERIFIED WITH: BLAKENEY NP, [id number] ON [id number] BY SDAVID  RRT    pCO2 arterial 37 32.0 - 48.0 mmHg   pO2, Arterial 65 (L) 83.0 - 108.0 mmHg   Bicarbonate 14.1 (L) 20.0 - 28.0 mmol/L   Acid-base deficit 13.3 (H) 0.0 - 2.0 mmol/L   O2 Saturation 86.4 %   Patient temperature 37.0    Collection site RIGHT RADIAL    Sample type ARTERIAL DRAW    Allens test (pass/fail) PASS PASS   Mechanical Rate 20.0     Comment: Performed at [facility], [address] [place], [place] [address]  Culture, respiratory (NON-Expectorated)     Status: None (Preliminary result)   Collection Time: 04/07/18  5:36 AM  Result Value Ref Range   Specimen Description      TRACHEAL ASPIRATE Performed at [facility], [address], [place], [place] [address]    Special Requests      NONE Performed at [facility], [address] [place], [place] [address]    Gram Stain      FEW WBC PRESENT, PREDOMINANTLY PMN FEW GRAM POSITIVE COCCI RARE GRAM NEGATIVE RODS    Culture      CULTURE REINCUBATED FOR BETTER GROWTH Performed at [facility], [place] [address] [place], [place] [address]    Report  Status PENDING   Blood gas, arterial     Status: Abnormal   Collection Time: 04/07/18  8:33  AM  Result Value Ref Range   FIO2 0.70    Mode PRESSURE REGULATED VOLUME CONTROL    VT 500 mL   LHR 20 resp/min   Peep/cpap 5.0 cm H20   pH, Arterial 7.23 (L) 7.350 - 7.450   pCO2 arterial 46 32.0 - 48.0 mmHg   pO2, Arterial 58 (L) 83.0 - 108.0 mmHg   Bicarbonate 19.3 (L) 20.0 - 28.0 mmol/L   Acid-base deficit 8.3 (H) 0.0 - 2.0 mmol/L   O2 Saturation 83.7 %   Patient temperature 37.0    Collection site A-LINE    Sample type ARTERIAL DRAW    Allens test (pass/fail) PASS PASS    Comment: Performed at [facility], [address] [place], [place] [address]  Glucose, capillary     Status: Abnormal   Collection Time: 04/07/18  8:33 AM  Result Value Ref Range   Glucose-Capillary 191 (H) 65 - 99 mg/dL  Troponin I     Status: Abnormal   Collection Time: 04/07/18  8:50 AM  Result Value Ref Range   Troponin I >65.00 (HH) <0.03 ng/mL    Comment: CRITICAL VALUE NOTED. VALUE IS CONSISTENT WITH PREVIOUSLY REPORTED/CALLED VALUE / FLC Performed at [facility], [place], [place] [address]   Heparin level (unfractionated)     Status: Abnormal   Collection Time: 04/07/18  8:50 AM  Result Value Ref Range   Heparin Unfractionated 1.05 (H) 0.30 - 0.70 IU/mL    Comment:        IF HEPARIN RESULTS ARE BELOW EXPECTED VALUES, AND PATIENT DOSAGE HAS BEEN CONFIRMED, SUGGEST FOLLOW UP TESTING OF ANTITHROMBIN III LEVELS. Performed at [facility], [address] [place], [place] [address]   APTT     Status: Abnormal   Collection Time: 04/07/18  8:50 AM  Result Value Ref Range   aPTT 114 (H) 24 - 36 seconds    Comment:        IF BASELINE aPTT IS ELEVATED, SUGGEST PATIENT RISK ASSESSMENT BE USED TO DETERMINE APPROPRIATE ANTICOAGULANT THERAPY. Performed at [facility], [address] [place], [place] [address]   Basic metabolic panel     Status:  Abnormal   Collection Time: 04/07/18  8:50 AM  Result Value Ref Range   Sodium 143 135 - 145 mmol/L   Potassium 4.3 3.5 - 5.1 mmol/L   Chloride 104 101 - 111 mmol/L   CO2 19 (L) 22 - 32 mmol/L   Glucose, Bld 257 (H) 65 - 99 mg/dL   BUN 37 (H) 6 - 20 mg/dL   Creatinine, Ser 3.11 (H) 0.61 - 1.24 mg/dL   Calcium 6.8 (L) 8.9 - 10.3 mg/dL   GFR calc non Af Amer 20 (L) >60 mL/min   GFR calc Af Amer 24 (L) >60 mL/min    Comment: (NOTE) The eGFR has been calculated using the CKD EPI equation. This calculation has not been validated in all clinical situations. eGFR's persistently <60 mL/min signify possible Chronic Kidney Disease.    Anion gap 20 (H) 5 - 15    Comment: Performed at [facility], [address] [place], [place] [address]  Magnesium     Status: Abnormal   Collection Time: 04/07/18  8:50 AM  Result Value Ref Range   Magnesium 2.5 (H) 1.7 - 2.4 mg/dL    Comment: Performed at [facility], [address] [place], [place] [address]  Phosphorus     Status: Abnormal  Collection Time: 04/07/18  8:50 AM  Result Value Ref Range   Phosphorus 6.5 (H) 2.5 - 4.6 mg/dL    Comment: Performed at [facility], [address] [place], [place] [address]  Calcium, ionized     Status: Abnormal   Collection Time: 04/07/18  8:50 AM  Result Value Ref Range   Calcium, Ionized, Serum 3.5 (L) 4.5 - 5.6 mg/dL    Comment: (NOTE) Performed At: [facility] [place], [place] [id number] Rush Farmer MD [phone] Performed at [facility], [address] [place], [place] [address]   Lactic acid, plasma     Status: Abnormal   Collection Time: 04/07/18  8:50 AM  Result Value Ref Range   Lactic Acid, Venous 10.0 (HH) 0.5 - 1.9 mmol/L    Comment: RESULT CONFIRMED BY MANUAL DILUTION / FLC CRITICAL RESULT CALLED TO, READ BACK BY AND VERIFIED WITH PAM MYERS @1012  04/07/18 FLC Performed at [facility], [address] [place], [place] [address]   CK     Status: Abnormal   Collection Time: 04/07/18  8:50 AM  Result Value Ref Range   Total CK 23,390 (H) 49 - 397 U/L    Comment: RESULT CONFIRMED BY MANUAL DILUTION FLC Performed at [facility], [address] [place], [place] [address]   Lactic acid, plasma     Status: Abnormal   Collection Time: 04/07/18 11:14 AM  Result Value Ref Range   Lactic Acid, Venous 9.3 (HH) 0.5 - 1.9 mmol/L    Comment: CRITICAL RESULT CALLED TO, READ BACK BY AND VERIFIED WITH PAM MYERS@1223  ON 04/07/18 BY HKP Performed at [facility], [address] [place], [place] [address]   Glucose, capillary     Status: Abnormal   Collection Time: 04/07/18 12:07 PM  Result Value Ref Range   Glucose-Capillary 175 (H) 65 - 99 mg/dL  Blood gas, arterial     Status: Abnormal   Collection Time: 04/07/18  3:35 PM  Result Value Ref Range   FIO2 0.80    Mode PRESSURE REGULATED VOLUME CONTROL    VT 500 mL   LHR 20 resp/min   Peep/cpap 5.0 cm H20   pH, Arterial 7.35 7.350 - 7.450   pCO2 arterial 53 (H) 32.0 - 48.0 mmHg   pO2, Arterial 68 (L) 83.0 - 108.0 mmHg   Bicarbonate 29.3 (H) 20.0 - 28.0 mmol/L   Acid-Base Excess 2.3 (H) 0.0 - 2.0 mmol/L   O2 Saturation 92.3 %   Patient temperature 37.0    Collection site A-LINE    Sample type ARTERIAL DRAW    Allens test (pass/fail) PASS PASS    Comment: Performed at [facility], [address] [place],  [address]  Troponin I     Status: Abnormal   Collection Time: 04/07/18  3:45 PM  Result Value Ref Range   Troponin I >65.00 (HH) <0.03 ng/mL    Comment: CRITICAL RESULT CALLED TO, READ BACK BY AND VERIFIED WITH BABARA THO 04/07/18 @ 1914 AKT Performed at [facility], [address] [place],  [address]   Glucose, capillary     Status: None   Collection Time: 04/07/18  3:50 PM  Result Value Ref Range   Glucose-Capillary 98 65 - 99 mg/dL  Basic metabolic panel     Status: Abnormal    Collection Time: 04/07/18  3:51 PM  Result Value Ref Range   Sodium 144 135 - 145 mmol/L  Potassium 5.0 3.5 - 5.1 mmol/L   Chloride 102 101 - 111 mmol/L   CO2 26 22 - 32 mmol/L   Glucose, Bld 112 (H) 65 - 99 mg/dL   BUN 39 (H) 6 - 20 mg/dL   Creatinine, Ser 3.18 (H) 0.61 - 1.24 mg/dL   Calcium 6.9 (L) 8.9 - 10.3 mg/dL   GFR calc non Af Amer 20 (L) >60 mL/min   GFR calc Af Amer 23 (L) >60 mL/min    Comment: (NOTE) The eGFR has been calculated using the CKD EPI equation. This calculation has not been validated in all clinical situations. eGFR's persistently <60 mL/min signify possible Chronic Kidney Disease.    Anion gap 16 (H) 5 - 15    Comment: Performed at [facility], [address] [place], [place] [address]  Magnesium     Status: None   Collection Time: 04/07/18  3:51 PM  Result Value Ref Range   Magnesium 2.1 1.7 - 2.4 mg/dL    Comment: Performed at [facility], [address] [place], [place] [address]  Phosphorus     Status: Abnormal   Collection Time: 04/07/18  3:51 PM  Result Value Ref Range   Phosphorus 6.7 (H) 2.5 - 4.6 mg/dL    Comment: Performed at [facility], [address] [place], [place] [address]  Calcium, ionized     Status: Abnormal   Collection Time: 04/07/18  3:51 PM  Result Value Ref Range   Calcium, Ionized, Serum 3.5 (L) 4.5 - 5.6 mg/dL    Comment: (NOTE) Performed At: [facility] [place], [place] [id number] Rush Farmer MD [id number] Performed at [facility], [address] [place], [place] [address]   APTT     Status: Abnormal   Collection Time: 04/07/18  6:10 PM  Result Value Ref Range   aPTT 113 (H) 24 - 36 seconds    Comment:        IF BASELINE aPTT IS ELEVATED, SUGGEST PATIENT RISK ASSESSMENT BE USED TO DETERMINE APPROPRIATE ANTICOAGULANT THERAPY. Performed at [facility], [address] [place], [place] [address]   Glucose, capillary      Status: None   Collection Time: 04/07/18  7:19 PM  Result Value Ref Range   Glucose-Capillary 76 65 - 99 mg/dL  Glucose, capillary     Status: Abnormal   Collection Time: 04/07/18 11:16 PM  Result Value Ref Range   Glucose-Capillary 53 (L) 65 - 99 mg/dL  Glucose, capillary     Status: Abnormal   Collection Time: 04/07/18 11:43 PM  Result Value Ref Range   Glucose-Capillary 220 (H) 65 - 99 mg/dL  Basic metabolic panel     Status: Abnormal   Collection Time: 04/08/18 12:31 AM  Result Value Ref Range   Sodium 140 135 - 145 mmol/L   Potassium 5.8 (H) 3.5 - 5.1 mmol/L   Chloride 98 (L) 101 - 111 mmol/L   CO2 29 22 - 32 mmol/L   Glucose, Bld 183 (H) 65 - 99 mg/dL   BUN 49 (H) 6 - 20 mg/dL   Creatinine, Ser 3.74 (H) 0.61 - 1.24 mg/dL   Calcium 6.3 (LL) 8.9 - 10.3 mg/dL    Comment: CRITICAL RESULT CALLED TO, READ BACK BY AND VERIFIED WITH BARBARA THAO ON 04/08/18 AT 0114 JAG    GFR calc non Af Amer 16 (L) >60 mL/min   GFR calc Af Amer 19 (L) >60 mL/min    Comment: (  NOTE) The eGFR has been calculated using the CKD EPI equation. This calculation has not been validated in all clinical situations. eGFR's persistently <60 mL/min signify possible Chronic Kidney Disease.    Anion gap 13 5 - 15    Comment: Performed at [facility], [address] [place], [place] [address]  Magnesium     Status: None   Collection Time: 04/08/18 12:31 AM  Result Value Ref Range   Magnesium 2.1 1.7 - 2.4 mg/dL    Comment: Performed at [facility], [address] [place], [place] [address]  Phosphorus     Status: None   Collection Time: 04/08/18 12:31 AM  Result Value Ref Range   Phosphorus 4.6 2.5 - 4.6 mg/dL    Comment: Performed at [facility], [address] [place], [place] [address]  Troponin I     Status: Abnormal   Collection Time: 04/08/18 12:31 AM  Result Value Ref Range   Troponin I >65.00 (HH) <0.03 ng/mL    Comment: CRITICAL VALUE NOTED. VALUE IS  CONSISTENT WITH PREVIOUSLY REPORTED/CALLED VALUE. JAG Performed at [facility], [address] [place], [place] [address]   APTT     Status: Abnormal   Collection Time: 04/08/18  2:00 AM  Result Value Ref Range   aPTT 107 (H) 24 - 36 seconds    Comment:        IF BASELINE aPTT IS ELEVATED, SUGGEST PATIENT RISK ASSESSMENT BE USED TO DETERMINE APPROPRIATE ANTICOAGULANT THERAPY. Performed at [facility], [address] [place], [place] [address]   Procalcitonin     Status: None   Collection Time: 04/08/18  4:04 AM  Result Value Ref Range   Procalcitonin 17.09 ng/mL    Comment:        Interpretation: PCT >= 10 ng/mL: Important systemic inflammatory response, almost exclusively due to severe bacterial sepsis or septic shock. (NOTE)       Sepsis PCT Algorithm           Lower Respiratory Tract                                      Infection PCT Algorithm    ----------------------------     ----------------------------         PCT < 0.25 ng/mL                PCT < 0.10 ng/mL         Strongly encourage             Strongly discourage   discontinuation of antibiotics    initiation of antibiotics    ----------------------------     -----------------------------       PCT 0.25 - 0.50 ng/mL            PCT 0.10 - 0.25 ng/mL               OR       >80% decrease in PCT            Discourage initiation of                                            antibiotics      Encourage discontinuation  of antibiotics    ----------------------------     -----------------------------         PCT >= 0.50 ng/mL              PCT 0.26 - 0.50 ng/mL                AND       <80% decrease in PCT             Encourage initiation of                                             antibiotics       Encourage continuation           of antibiotics    ----------------------------     -----------------------------        PCT >= 0.50 ng/mL                  PCT > 0.50 ng/mL                AND         increase in PCT                  Strongly encourage                                      initiation of antibiotics    Strongly encourage escalation           of antibiotics                                     -----------------------------                                           PCT <= 0.25 ng/mL                                                 OR                                        > 80% decrease in PCT                                     Discontinue / Do not initiate                                             antibiotics Performed at [facility], [address] [place], [place] [address]   Hepatic function panel     Status: Abnormal   Collection Time: 04/08/18  4:04 AM  Result Value Ref Range   Total Protein 4.5 (L) 6.5 - 8.1 g/dL  Albumin 2.6 (L) 3.5 - 5.0 g/dL   AST 2,546 (H) 15 - 41 U/L    Comment: RESULT CONFIRMED BY MANUAL DILUTION. JAG   ALT 1,860 (H) 17 - 63 U/L   Alkaline Phosphatase 55 38 - 126 U/L   Total Bilirubin 1.1 0.3 - 1.2 mg/dL   Bilirubin, Direct 0.3 0.1 - 0.5 mg/dL   Indirect Bilirubin 0.8 0.3 - 0.9 mg/dL    Comment: Performed at [facility], [address] [place], [place] [address]  CBC     Status: Abnormal   Collection Time: 04/08/18  4:04 AM  Result Value Ref Range   WBC 24.1 (H) 3.8 - 10.6 K/uL   RBC 3.99 (L) 4.40 - 5.90 MIL/uL   Hemoglobin 14.0 13.0 - 18.0 g/dL   HCT 41.0 40.0 - 52.0 %   MCV 102.9 (H) 80.0 - 100.0 fL   MCH 35.0 (H) 26.0 - 34.0 pg   MCHC 34.0 32.0 - 36.0 g/dL   RDW 12.9 11.5 - 14.5 %   Platelets 69 (L) 150 - 440 K/uL    Comment: Performed at [facility], [place], [place] [address]  Heparin level (unfractionated)     Status: Abnormal   Collection Time: 04/08/18  4:04 AM  Result Value Ref Range   Heparin Unfractionated 0.80 (H) 0.30 - 0.70 IU/mL    Comment:        IF HEPARIN RESULTS ARE BELOW EXPECTED VALUES, AND PATIENT DOSAGE HAS BEEN CONFIRMED, SUGGEST FOLLOW  UP TESTING OF ANTITHROMBIN III LEVELS. Performed at [facility], [address] [place], [place] [address]   Blood gas, arterial     Status: Abnormal   Collection Time: 04/08/18  4:04 AM  Result Value Ref Range   FIO2 1.00    Delivery systems VENTILATOR    Mode PRESSURE REGULATED VOLUME CONTROL    VT 500 mL   LHR 20 resp/min   Peep/cpap 5.0 cm H20   pH, Arterial 7.48 (H) 7.350 - 7.450   pCO2 arterial 44 32.0 - 48.0 mmHg   pO2, Arterial 112 (H) 83.0 - 108.0 mmHg   Bicarbonate 32.8 (H) 20.0 - 28.0 mmol/L   Acid-Base Excess 8.2 (H) 0.0 - 2.0 mmol/L   O2 Saturation 98.7 %   Patient temperature 37.0    Sample type ARTERIAL DRAW     Comment: Performed at [facility], [address] [place], [place] [address]  Glucose, capillary     Status: Abnormal   Collection Time: 04/08/18  4:04 AM  Result Value Ref Range   Glucose-Capillary 63 (L) 65 - 99 mg/dL  Glucose, capillary     Status: Abnormal   Collection Time: 04/08/18  4:53 AM  Result Value Ref Range   Glucose-Capillary 106 (H) 65 - 99 mg/dL  Glucose, capillary     Status: Abnormal   Collection Time: 04/08/18  8:06 AM  Result Value Ref Range   Glucose-Capillary 102 (H) 65 - 99 mg/dL  Basic metabolic panel     Status: Abnormal   Collection Time: 04/08/18  8:10 AM  Result Value Ref Range   Sodium 142 135 - 145 mmol/L   Potassium 4.9 3.5 - 5.1 mmol/L   Chloride 94 (L) 101 - 111 mmol/L   CO2 35 (H) 22 - 32 mmol/L   Glucose, Bld 108 (H) 65 - 99 mg/dL   BUN 57 (H) 6 - 20 mg/dL   Creatinine, Ser 4.24 (H) 0.61 - 1.24 mg/dL  Calcium 6.5 (L) 8.9 - 10.3 mg/dL   GFR calc non Af Amer 14 (L) >60 mL/min   GFR calc Af Amer 16 (L) >60 mL/min    Comment: (NOTE) The eGFR has been calculated using the CKD EPI equation. This calculation has not been validated in all clinical situations. eGFR's persistently <60 mL/min signify possible Chronic Kidney Disease.    Anion gap 13 5 - 15    Comment: Performed at [facility], [address] [place], [place] [address]  Magnesium     Status: None   Collection Time: 04/08/18  8:10 AM  Result Value Ref Range   Magnesium 1.9 1.7 - 2.4 mg/dL    Comment: Performed at [facility], [address] [place], [place] [address]  Phosphorus     Status: None   Collection Time: 04/08/18  8:10 AM  Result Value Ref Range   Phosphorus 4.3 2.5 - 4.6 mg/dL    Comment: Performed at [facility], [address] [place], [place] [address]  Lactic acid, plasma     Status: Abnormal   Collection Time: 04/08/18  9:44 AM  Result Value Ref Range   Lactic Acid, Venous 2.9 (HH) 0.5 - 1.9 mmol/L    Comment: CRITICAL RESULT CALLED TO, READ BACK BY AND VERIFIED WITH PAM MEYERS @1013  04/08/18 FLC Performed at [facility], [address] [place], [place] [address]   Glucose, capillary     Status: Abnormal   Collection Time: 04/08/18 11:48 AM  Result Value Ref Range   Glucose-Capillary 46 (L) 65 - 99 mg/dL  Glucose, capillary     Status: Abnormal   Collection Time: 04/08/18 11:50 AM  Result Value Ref Range   Glucose-Capillary 107 (H) 65 - 99 mg/dL  Lactic acid, plasma     Status: Abnormal   Collection Time: 04/08/18 12:10 PM  Result Value Ref Range   Lactic Acid, Venous 2.9 (HH) 0.5 - 1.9 mmol/L    Comment: CRITICAL RESULT CALLED TO, READ BACK BY AND VERIFIED WITH PAM MEYERS @1238  04/08/18 FLC Performed at [facility], [address] [place], [place] [address]   Glucose, capillary     Status: None   Collection Time: 04/08/18  1:49 PM  Result Value Ref Range   Glucose-Capillary 99 65 - 99 mg/dL   No components found for: ESR, C REACTIVE PROTEIN MICRO: Recent Results (from the past 720 hour(s))  MRSA PCR Screening     Status: None   Collection Time: 03/31/2018 11:45 PM  Result Value Ref Range Status   MRSA by PCR NEGATIVE NEGATIVE Final    Comment:        The GeneXpert MRSA Assay (FDA approved for NASAL specimens only), is  one component of a comprehensive MRSA colonization surveillance program. It is not intended to diagnose MRSA infection nor to guide or monitor treatment for MRSA infections. Performed at [facility], [address], [place], [place] [address]   Urine Culture     Status: None   Collection Time: 03/15/2018 11:45 PM  Result Value Ref Range Status   Specimen Description   Final    URINE, RANDOM Performed at [facility], [address], [place], [place] [address]    Special Requests   Final    NONE Performed at [facility], [address] [place],  [address]    Culture   Final    NO GROWTH Performed at [facility], [address], [place], [place] [address]    Report Status 04/08/2018 FINAL  Final  CULTURE, BLOOD (ROUTINE X 2) w Reflex to ID Panel     Status: None (Preliminary result)   Collection Time: 04/07/18  3:54 AM  Result Value Ref Range Status   Specimen Description BLOOD LEFT ANTECUBITAL  Final   Special Requests   Final    BOTTLES DRAWN AEROBIC AND ANAEROBIC Blood Culture adequate volume   Culture   Final    NO GROWTH 1 DAY Performed at [facility], [address] [place], [place] [address]    Report Status PENDING  Incomplete  CULTURE, BLOOD (ROUTINE X 2) w Reflex to ID Panel     Status: None (Preliminary result)   Collection Time: 04/07/18  4:05 AM  Result Value Ref Range Status   Specimen Description BLOOD LEFT WRIST  Final   Special Requests   Final    BOTTLES DRAWN AEROBIC AND ANAEROBIC Blood Culture adequate volume   Culture   Final    NO GROWTH 1 DAY Performed at [facility], [address] [place], [place] [address]    Report Status PENDING  Incomplete  Culture, respiratory (NON-Expectorated)     Status: None (Preliminary result)   Collection Time: 04/07/18  5:36 AM  Result Value Ref Range Status   Specimen Description   Final    TRACHEAL ASPIRATE Performed at [facility],  [address] [place], [place] [address]    Special Requests   Final    NONE Performed at [facility], [address] [place], [place] [address]    Gram Stain   Final    FEW WBC PRESENT, PREDOMINANTLY PMN FEW GRAM POSITIVE COCCI RARE GRAM NEGATIVE RODS    Culture   Final    CULTURE REINCUBATED FOR BETTER GROWTH Performed at [facility], [place] [address], [place],  [address]    Report Status PENDING  Incomplete    IMAGING: Dg Chest 1 View  Result Date: 04/07/2018 CLINICAL DATA:  Endotracheal tube placement EXAM: CHEST  1 VIEW COMPARISON:  03/16/2018 FINDINGS: Endotracheal tube has been placed with tip measuring 3.4 cm above the carina. Right central venous catheter with tip over the mid SVC region. No pneumothorax. Enteric tube tip is off the field of view but below the left hemidiaphragm. Tubing over the left chest consistent with ventricular peritoneal shunt tubing. Cardiac enlargement with mild pulmonary vascular congestion. No consolidation or edema. No blunting of costophrenic angles. Mediastinal contours appear intact. IMPRESSION: Appliances appear in satisfactory position. Cardiac enlargement. Mild pulmonary vascular congestion. No edema or consolidation. Electronically Signed   By: Lucienne Capers M.D.   On: 04/07/2018 00:31   Dg Abd 1 View  Result Date: 04/07/2018 CLINICAL DATA:  OG tube placement EXAM: ABDOMEN - 1 VIEW COMPARISON:  04/01/2018 FINDINGS: Enteric tube tip is in the left mid abdomen consistent with location in the distal stomach. Visualized colon is not abnormally distended. Ventricular peritoneal shunt tubing is present. IMPRESSION: Enteric tube tip is in the left mid abdomen consistent with location in the distal stomach. Electronically Signed   By: Lucienne Capers M.D.   On: 04/07/2018 00:32   Ct Head Wo Contrast  Result Date: 03/20/2018 CLINICAL DATA:  Altered LOC, cardiac arrest EXAM: CT HEAD WITHOUT CONTRAST TECHNIQUE: Contiguous  axial images were obtained from the base of the skull through the vertex without intravenous contrast. COMPARISON:  None. FINDINGS: Brain: No acute territorial infarction or  hemorrhage is visualized. Mild encephalomalacia in the left frontal lobe adjacent to ventricular catheter. Left frontal catheter terminates at the midline posteriorly, just above the pineal region. Slight asymmetric appearance of the ventricles, right larger than left but no ventricular enlargement. Possible small focus of encephalomalacia in the left posterior temporal lobe. Vascular: No hyperdense vessels. Scattered calcifications at the carotid siphons. Skull: No fracture Sinuses/Orbits: Mild mucosal thickening in the ethmoid sinuses. No acute orbital abnormality Other: None IMPRESSION: 1. No definite CT evidence for acute intracranial abnormality. 2. Left frontal ventricular catheter with adjacent encephalomalacia in the frontal lobe. Slight asymmetric appearance of ventricles, right larger than left but without hydrocephalus. Electronically Signed   By: Donavan Foil M.D.   On: 03/21/2018 23:53   Dg Chest Port 1 View  Result Date: 04/08/2018 CLINICAL DATA:  CHF, intubated patient, malnutrition, HIV. EXAM: PORTABLE CHEST 1 VIEW COMPARISON:  Portable chest x-ray of April 06, 2018 FINDINGS: The lungs are mildly hyperinflated. The perihilar lung markings are increased and more conspicuous today. External pacemaker defibrillator pads are present. The heart is top normal in size. The pulmonary vascularity is engorged and indistinct. The endotracheal tube tip projects approximately 3 cm above the carina. The esophagogastric tube tip and proximal port project below the GE junction. The right internal jugular venous catheter tip projects over the proximal SVC. A ventriculoperitoneal shunt tube is visible to the left of midline. IMPRESSION: CHF with interstitial edema much more conspicuous than on yesterday's study. Probable posterior layering of  pleural effusions. The support tubes and devices are in reasonable position. Electronically Signed   By: Luca Dyar  [place] M.D.   On: 04/08/2018 09:07   Dg Chest Portable 1 View  Result Date: 03/17/2018 CLINICAL DATA:  Post code EXAM: PORTABLE CHEST 1 VIEW COMPARISON:  None. FINDINGS: Endotracheal tube tip is about 6.1 cm superior to carina. Esophageal tube tip projects over the distal esophagus. Right IJ central venous catheter tip over the SVC. No pneumothorax. No focal opacity or pleural effusion. Mild cardiomegaly. Probable left-sided shunt catheter. IMPRESSION: 1. Endotracheal tube tip about 6.1 cm superior to carina 2. Esophageal tube tip overlies the distal esophagus, further advancement recommended for more optimal positioning. 3. Mild cardiomegaly without acute infiltrate 4. Right IJ central venous catheter tip over the SVC without pneumothorax. Electronically Signed   By: Donavan Foil M.D.   On: 03/27/2018 22:23   Dg Abd Portable 1 View  Result Date: 03/10/2018 CLINICAL DATA:  OG tube placement EXAM: PORTABLE ABDOMEN - 1 VIEW COMPARISON:  None. FINDINGS: The tip of the esophageal tube may be visible near the distal esophagus. Mild gaseous enlargement of stomach. Left-sided shunt tubing courses toward the pelvis but is incompletely imaged. Nonobstructed gas pattern. IMPRESSION: Esophageal tube tip appears to overlie the distal esophagus. Further advancement recommended for more optimal positioning Electronically Signed   By: Donavan Foil M.D.   On: 04/05/2018 22:24    Assessment:   Brian Boyle is a 62 y.o. male well controlled HIV, last CD and VL at [place] and < 20 in Nov 2018 on [place] who had out of hospital cardiac arrest and was admitted to the ICU after ROSC after 40 minutes of down time.  He had a history of cardiomyopathy, prior NSVT but was apparently stable prior to event.  His HIV was well controlled and unlikely that his HIV or medications have much impact on his current dire  situation.   Recommendations Hold ARVs- (Biktarvy) during critical illness. If he improves  and CrCl > 30 can restart.   Thank you very much for allowing me to participate in the care of this patient. Please call with questions.   Cheral Marker. Ola Spurr, MD

## 2018-04-08 NOTE — Progress Notes (Signed)
Pharmacy Antibiotic Note  Brian Boyle is a 62 y.o. male admitted on 03/19/2018 s/p cardiac arrest and placed on TTM protocol.  Pharmacy has been consulted for Zosyn dosing for possible aspiration.  Adding vancomycin until patient can be evaluated by ID.   Plan: Zosyn 3.375 g EI q 12 hours with patient in ARF.   Vancomycin 1500 mg iv once and will f/u ID recommendations.   Height:  (175.3 cm) Weight: 150 lb 2.1 oz (68.1 kg) IBW/kg (Calculated) : 70.7  Temp (24hrs), Avg:97.4 F (36.3 C), Min:96.1 F (35.6 C), Max:99.1 F (37.3 C)  Recent Labs  Lab 03/30/2018 2146  04/07/18 0353 04/07/18 0416 04/07/18 0850 04/07/18 1114 04/07/18 1551 04/08/18 0031 04/08/18 0404 04/08/18 0810 04/08/18 0944 04/08/18 1210  WBC 15.4*  --  21.5*  --   --   --   --   --  24.1*  --   --   --   CREATININE 2.71*  --  2.63*  --  3.11*  --  3.18* 3.74*  --  4.24*  --   --   LATICACIDVEN  --    < >  --  10.7* 10.0* 9.3*  --   --   --   --  2.9* 2.9*   < > = values in this interval not displayed.    Estimated Creatinine Clearance: 18.1 mL/min (A) (by C-G formula based on SCr of 4.24 mg/dL (H)).    No Known Allergies  Antimicrobials this admission: Azithromycin and CTX x 1 Zosyn 5/1 >>  Vancomycin 5/2 >>  Dose adjustments this admission:   Microbiology results: 5/1 BCx: NGTD 4/30 UCx: NG 5/1 TA: GPC, GNR 4/30 MRSA PCR: negative  Thank you for allowing pharmacy to be a part of this patient's care.  Luisa [place] D 04/08/2018 2:08 PM

## 2018-04-08 NOTE — Progress Notes (Signed)
Name: Brian Boyle MRN: [id number] DOB: 1956/05/15     CONSULTATION DATE: 03/09/2018  Subjective & Objective: Remains on versed, Fentanyl, Dopamine, Levophed and off Bicarb gtt frequent myoclonic activity.  PAST MEDICAL HISTORY :   has a past medical history of HIV (human immunodeficiency virus infection) (HCC).  has no past surgical history on file. Prior to Admission medications   Medication Sig Start Date End Date Taking? Authorizing Provider  BIKTARVY 50-200-25 MG TABS tablet Take 1 tablet by mouth daily. 03/19/18  Yes [provider]  cetirizine (ZYRTEC) 5 MG tablet Take 10 mg by mouth daily. 03/04/18  Yes [provider]  ELIQUIS 2.5 MG TABS tablet Take 1 tablet by mouth 2 (two) times daily. 03/22/18  Yes [provider]  fluticasone (FLONASE) 50 MCG/ACT nasal spray Place 1 spray into both nostrils daily. 02/04/18  Yes [provider]  furosemide (LASIX) 40 MG tablet Take 1 tablet by mouth daily. 01/16/18  Yes [provider]  LORazepam (ATIVAN) 0.5 MG tablet Take 2 tablets by mouth daily as needed. 01/14/18  Yes [provider]  losartan (COZAAR) 25 MG tablet Take 12.5 mg by mouth daily. 03/27/18  Yes [provider]  metoprolol succinate (TOPROL-XL) 25 MG 24 hr tablet Take 1 tablet by mouth daily. 01/16/18  Yes [provider]  Multiple Vitamin (MULTIVITAMIN) capsule Take 1 capsule by mouth 4 (four) times daily.   Yes [provider]  polyethylene glycol (MIRALAX / GLYCOLAX) packet Take 17 g by mouth daily as needed.   Yes [provider]  valACYclovir (VALTREX) 1000 MG tablet Take 2 tablets ( ) by mouth at onset of symptoms and repeat after 12 hours 02/04/18  Yes [provider]   No Known Allergies  FAMILY HISTORY:  family history is not on file. SOCIAL HISTORY:    REVIEW OF SYSTEMS:   Unable to obtain due to critical illness   VITAL SIGNS: Temp:  [96.1 F (35.6 C)-99.1 F  (37.3 C)] 99.1 F (37.3 C) (05/02 0800) Pulse Rate:  [59-80] 67 (05/02 0700) Resp:  [9-23] 23 (05/02 0700) BP: (102-120)/(64-92) 109/72 (05/02 0700) SpO2:  [90 %-100 %] 100 % (05/02 0800) Arterial Line BP: (111-143)/(63-77) 118/67 (05/02 0700) FiO2 (%):  [80 %-100 %] 100 % (05/02 0800) Weight:  [68.1 kg (150 lb 2.1 oz)] 68.1 kg (150 lb 2.1 oz) (05/02 0500)  Physical Examination:  On versed to stop Mycolonic activity. No corneal , No Doll, no gag, no DTR, no triggering the vent and no spontaneous movements. On vent, no distress, BEAE and no rales. S1 & S3 audible and no murmur Benign abdomen with feeble peristalses No leg edema  ASSESSMENT / PLAN:  Acute respiratory failure. Poor weaning parameters -Monitor ABG,optimize vent settings and continue with vent support.  Anoxic encephalopathy s/p cardiac arrest. Rewarming after hypothermia -On Versed + Keppra fo Myclonic seizure -Follow with EEG and neurology  Cardiogenic shock post cardiac arrest(VT vs Torsade de pointes) on Dopamin and Levophed. H/o CHF, LV mural thrombus with MI, non sustained VT -Follow with ECHO and cardiology  Metabolic acidosis (improved) with lactic acidemia and sepsis -Off Bicarb. Gtt -Zosyn + Vanc -Monitor lactic acid and Procal.   Pneumonia and atelectasis with aspiration. Res Cult GPC + GNR. Bibasilar airspace disease. -Vanc + Zosyn. Monitor CXR + CBC + FIO2 -Follow with ID for ABX recommendation  AKI with ATN due to sepsis and renal hypoperfusion -Optimize hydration, hemodynamics, renal adjustment for meds,monitor renal panel and urine out  put.  Elevated LFTs with acute ischemic hepatitis s/p cardiac arrest. -Optimize hemodynamics, avoid hepatotoxins and monitor LFT.  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 -Follow with ID consult  Thrombocytopenia. On Heparin for LV mural thrombus -Monitor Plat and consider D/C Heparin and starting Arixtra  DNR  DVT & GI  prophylaxis. Continue with supportive care  Critical care time 45 min

## 2018-04-08 NOTE — Progress Notes (Signed)
Subjective: Patient remains intubated and sedated. Patient now off Fentanyl but Versed has been increased due to breakthrough myoclonic activity.    Objective: Current vital signs: BP 96/67   Pulse 74   Temp 98.8 F (37.1 C) (Bladder)   Resp 19   Ht _0  (1.753 m)   Wt 68.1 kg (150 lb 2.1 oz)   SpO2 100%   BMI 22.17 kg/m  Vital signs in last 24 hours: Temp:  [96.1 F (35.6 C)-99.1 F (37.3 C)] 98.8 F (37.1 C) (05/02 1300) Pulse Rate:  [59-80] 74 (05/02 1300) Resp:  [9-23] 19 (05/02 1300) BP: (91-120)/(62-92) 96/67 (05/02 1300) SpO2:  [95 %-100 %] 100 % (05/02 1300) Arterial Line BP: (99-132)/(58-77) 116/67 (05/02 1300) FiO2 (%):  [80 %-100 %] 100 % (05/02 1222) Weight:  [68.1 kg (150 lb 2.1 oz)] 68.1 kg (150 lb 2.1 oz) (05/02 0500)  Intake/Output from previous day: 05/01 0701 - 05/02 0700 In: 4405.9 [I.V.:4298.4; IV Piggyback:107.5] Out: 110 [Urine:110] Intake/Output this shift: Total I/O In: 520 [I.V.:285.3; NG/GT:19.7; IV Piggyback:215] Out: 5 [Urine:5] Nutritional status:  Diet Order    None      Neurologic Exam: Mental Status: Patient does not respond to verbal stimuli.  Does not respond to deep sternal rub.  Does not follow commands.  No verbalizations noted.  Cranial Nerves: II: patient does not respond confrontation bilaterally, right pupil slightly larger than left with both being unreactive.   III,IV,VI: doll's response absent bilaterally.  V,VII: corneal reflex absent bilaterally  VIII: patient does not respond to verbal stimuli IX,X: gag reflex reduced, XI: trapezius strength unable to test bilaterally XII: tongue strength unable to test Motor: Extremities flaccid throughout.  No spontaneous movement noted.  No purposeful movements noted. Sensory: Does not respond to noxious stimuli in any extremity.  Lab Results: Basic Metabolic Panel: Recent Labs  Lab 04/07/18 0038 04/07/18 0353 04/07/18 0850 04/07/18 1551 04/08/18 0031 04/08/18 0810   NA  --  147* 143 144 140 142  K  --  4.2 4.3 5.0 5.8* 4.9  CL  --  110 104 102 98* 94*  CO2  --  16* 19* 26 29 35*  GLUCOSE  --  186* 257* 112* 183* 108*  BUN  --  31* 37* 39* 49* 57*  CREATININE  --  2.63* 3.11* 3.18* 3.74* 4.24*  CALCIUM  --  7.2* 6.8* 6.9* 6.3* 6.5*  MG 3.1*  --  2.5* 2.1 2.1 1.9  PHOS  --   --  6.5* 6.7* 4.6 4.3    Liver Function Tests: Recent Labs  Lab 03/10/2018 2146 04/08/18 0404  AST 972* 2,546*  ALT 944* 1,860*  ALKPHOS 81 55  BILITOT 1.4* 1.1  PROT 6.2* 4.5*  ALBUMIN 3.7 2.6*   No results for input(s): LIPASE, AMYLASE in the last 168 hours. No results for input(s): AMMONIA in the last 168 hours.  CBC: Recent Labs  Lab 03/19/2018 2146 04/07/18 0353 04/08/18 0404  WBC 15.4* 21.5* 24.1*  NEUTROABS 13.6*  --   --   HGB 14.5 15.3 14.0  HCT 44.1 44.9 41.0  MCV 106.7* 106.6* 102.9*  PLT 143* 117* 69*    Cardiac Enzymes: Recent Labs  Lab 03/26/2018 2146 04/07/18 0353 04/07/18 0850 04/07/18 1545 04/08/18 0031  CKTOTAL  --   --  23,390*  --   --   TROPONINI 17.60* >65.00* >65.00* >65.00* >65.00*    Lipid Panel: Recent Labs  Lab 04/03/2018 2146  CHOL 199  TRIG 75  HDL 68  CHOLHDL 2.9  VLDL 15  LDLCALC 116*    CBG: Recent Labs  Lab 04/08/18 0404 04/08/18 0453 04/08/18 0806 04/08/18 1148 04/08/18 1150  GLUCAP 63* 106* 102* 67* 107*    Microbiology: Results for orders placed or performed during the hospital encounter of 03/28/2018  MRSA PCR Screening     Status: None   Collection Time: 03/23/2018 11:45 PM  Result Value Ref Range Status   MRSA by PCR NEGATIVE NEGATIVE Final    Comment:        The GeneXpert MRSA Assay (FDA approved for NASAL specimens only), is one component of a comprehensive MRSA colonization surveillance program. It is not intended to diagnose MRSA infection nor to guide or monitor treatment for MRSA infections. Performed at [facility], [address] [place], [place] [address]   Urine  Culture     Status: None   Collection Time: 03/17/2018 11:45 PM  Result Value Ref Range Status   Specimen Description   Final    URINE, RANDOM Performed at [facility], [address] [place], [place] [address]    Special Requests   Final    NONE Performed at [facility], [address], [place], [place] [address]    Culture   Final    NO GROWTH Performed at [facility], [place] [address], [place], [place] [address]    Report Status 04/08/2018 FINAL  Final  CULTURE, BLOOD (ROUTINE X 2) w Reflex to ID Panel     Status: None (Preliminary result)   Collection Time: 04/07/18  3:54 AM  Result Value Ref Range Status   Specimen Description BLOOD LEFT ANTECUBITAL  Final   Special Requests   Final    BOTTLES DRAWN AEROBIC AND ANAEROBIC Blood Culture adequate volume   Culture   Final    NO GROWTH 1 DAY Performed at [facility], [address] [place], [place] [address]    Report Status PENDING  Incomplete  CULTURE, BLOOD (ROUTINE X 2) w Reflex to ID Panel     Status: None (Preliminary result)   Collection Time: 04/07/18  4:05 AM  Result Value Ref Range Status   Specimen Description BLOOD LEFT WRIST  Final   Special Requests   Final    BOTTLES DRAWN AEROBIC AND ANAEROBIC Blood Culture adequate volume   Culture   Final    NO GROWTH 1 DAY Performed at [facility], [address] [place], [place] [address]    Report Status PENDING  Incomplete  Culture, respiratory (NON-Expectorated)     Status: None (Preliminary result)   Collection Time: 04/07/18  5:36 AM  Result Value Ref Range Status   Specimen Description   Final    TRACHEAL ASPIRATE Performed at [facility], [address], [place], [place] [address]    Special Requests   Final    NONE Performed at [facility], [address] [place], [place] [address]    Gram Stain   Final    FEW WBC PRESENT, PREDOMINANTLY PMN FEW GRAM POSITIVE COCCI RARE GRAM NEGATIVE  RODS    Culture   Final    CULTURE REINCUBATED FOR BETTER GROWTH Performed at [facility], [place] [address] [place], [place] [address]    Report Status PENDING  Incomplete    Coagulation Studies: Recent Labs    04/05/2018 02-23-45  LABPROT 20.6*  INR 1.79    Imaging: Dg Chest 1 View  Result  Date: 04/07/2018 CLINICAL DATA:  Endotracheal tube placement EXAM: CHEST  1 VIEW COMPARISON:  03/12/2018 FINDINGS: Endotracheal tube has been placed with tip measuring 3.4 cm above the carina. Right central venous catheter with tip over the mid SVC region. No pneumothorax. Enteric tube tip is off the field of view but below the left hemidiaphragm. Tubing over the left chest consistent with ventricular peritoneal shunt tubing. Cardiac enlargement with mild pulmonary vascular congestion. No consolidation or edema. No blunting of costophrenic angles. Mediastinal contours appear intact. IMPRESSION: Appliances appear in satisfactory position. Cardiac enlargement. Mild pulmonary vascular congestion. No edema or consolidation. Electronically Signed   By: Lucienne Capers M.D.   On: 04/07/2018 00:31   Dg Abd 1 View  Result Date: 04/07/2018 CLINICAL DATA:  OG tube placement EXAM: ABDOMEN - 1 VIEW COMPARISON:  04/03/2018 FINDINGS: Enteric tube tip is in the left mid abdomen consistent with location in the distal stomach. Visualized colon is not abnormally distended. Ventricular peritoneal shunt tubing is present. IMPRESSION: Enteric tube tip is in the left mid abdomen consistent with location in the distal stomach. Electronically Signed   By: Lucienne Capers M.D.   On: 04/07/2018 00:32   Ct Head Wo Contrast  Result Date: 03/31/2018 CLINICAL DATA:  Altered LOC, cardiac arrest EXAM: CT HEAD WITHOUT CONTRAST TECHNIQUE: Contiguous axial images were obtained from the base of the skull through the vertex without intravenous contrast. COMPARISON:  None. FINDINGS: Brain: No acute territorial infarction or hemorrhage is  visualized. Mild encephalomalacia in the left frontal lobe adjacent to ventricular catheter. Left frontal catheter terminates at the midline posteriorly, just above the pineal region. Slight asymmetric appearance of the ventricles, right larger than left but no ventricular enlargement. Possible small focus of encephalomalacia in the left posterior temporal lobe. Vascular: No hyperdense vessels. Scattered calcifications at the carotid siphons. Skull: No fracture Sinuses/Orbits: Mild mucosal thickening in the ethmoid sinuses. No acute orbital abnormality Other: None IMPRESSION: 1. No definite CT evidence for acute intracranial abnormality. 2. Left frontal ventricular catheter with adjacent encephalomalacia in the frontal lobe. Slight asymmetric appearance of ventricles, right larger than left but without hydrocephalus. Electronically Signed   By: Donavan Foil M.D.   On: 04/01/2018 23:53   Dg Chest Port 1 View  Result Date: 04/08/2018 CLINICAL DATA:  CHF, intubated patient, malnutrition, HIV. EXAM: PORTABLE CHEST 1 VIEW COMPARISON:  Portable chest x-ray of April 06, 2018 FINDINGS: The lungs are mildly hyperinflated. The perihilar lung markings are increased and more conspicuous today. External pacemaker defibrillator pads are present. The heart is top normal in size. The pulmonary vascularity is engorged and indistinct. The endotracheal tube tip projects approximately 3 cm above the carina. The esophagogastric tube tip and proximal port project below the GE junction. The right internal jugular venous catheter tip projects over the proximal SVC. A ventriculoperitoneal shunt tube is visible to the left of midline. IMPRESSION: CHF with interstitial edema much more conspicuous than on yesterday's study. Probable posterior layering of pleural effusions. The support tubes and devices are in reasonable position. Electronically Signed   By: David  Martinique M.D.   On: 04/08/2018 09:07   Dg Chest Portable 1 View  Result  Date: 04/05/2018 CLINICAL DATA:  Post code EXAM: PORTABLE CHEST 1 VIEW COMPARISON:  None. FINDINGS: Endotracheal tube tip is about 6.1 cm superior to carina. Esophageal tube tip projects over the distal esophagus. Right IJ central venous catheter tip over the SVC. No pneumothorax. No focal opacity or pleural effusion. Mild cardiomegaly. Probable  left-sided shunt catheter. IMPRESSION: 1. Endotracheal tube tip about 6.1 cm superior to carina 2. Esophageal tube tip overlies the distal esophagus, further advancement recommended for more optimal positioning. 3. Mild cardiomegaly without acute infiltrate 4. Right IJ central venous catheter tip over the SVC without pneumothorax. Electronically Signed   By: Donavan Foil M.D.   On: 03/13/2018 22:23   Dg Abd Portable 1 View  Result Date: 03/28/2018 CLINICAL DATA:  OG tube placement EXAM: PORTABLE ABDOMEN - 1 VIEW COMPARISON:  None. FINDINGS: The tip of the esophageal tube may be visible near the distal esophagus. Mild gaseous enlargement of stomach. Left-sided shunt tubing courses toward the pelvis but is incompletely imaged. Nonobstructed gas pattern. IMPRESSION: Esophageal tube tip appears to overlie the distal esophagus. Further advancement recommended for more optimal positioning Electronically Signed   By: Donavan Foil M.D.   On: 04/01/2018 22:24    Medications:  I have reviewed the patient's current medications. Scheduled: . aspirin  324 mg Oral Once  . chlorhexidine gluconate (MEDLINE KIT)  15 mL Mouth Rinse BID  . docusate sodium  100 mg Oral BID  . feeding supplement (VITAL HIGH PROTEIN)  1,000 mL Per Tube Q24H  . hydrocortisone sod succinate (SOLU-CORTEF) inj  50 mg Intravenous Q8H  . insulin aspart  0-9 Units Subcutaneous Q4H  . mouth rinse  15 mL Mouth Rinse 10 times per day    Assessment/Plan: Patient remains intubated and sedated.  Unresponsive.  Temp above 36C since 0100.  Lab work suggests injury to kidneys and liver with LFT's and BUN/Cr  continuing to rise.  Likely hypoperfusion injury.  There is concern that that brain has incurred some anoxic injury as well.  Initial head CT unremarkable.  Patient now day 2 since event.    Recommendations: 1.  Repeat head CT on 5/3 to evaluate for hypoxic brain injury.   2.  Will likely not need to further increase Keppra dosing since worsening of renal function will delay clearance.     LOS: 2 days   Alexis Goodell, MD Neurology [phone] 04/08/2018  1:28 PM

## 2018-04-08 NOTE — Progress Notes (Signed)
[employer] [facility] - [place] [facility]   PATIENT NAME: Brian Boyle    MR#:  [id number]  DATE OF BIRTH:  10-06-1956  SUBJECTIVE:  CHIEF COMPLAINT:   Chief Complaint  Patient presents with  . Cardiac Arrest  Remains critically ill and discussion with intensivist, neurology to see-probable need for hospice/comfort care going forward given poor prognosis  REVIEW OF SYSTEMS:  CONSTITUTIONAL: No fever, fatigue or weakness.  EYES: No blurred or double vision.  EARS, NOSE, AND THROAT: No tinnitus or ear pain.  RESPIRATORY: No cough, shortness of breath, wheezing or hemoptysis.  CARDIOVASCULAR: No chest pain, orthopnea, edema.  GASTROINTESTINAL: No nausea, vomiting, diarrhea or abdominal pain.  GENITOURINARY: No dysuria, hematuria.  ENDOCRINE: No polyuria, nocturia,  HEMATOLOGY: No anemia, easy bruising or bleeding SKIN: No rash or lesion. MUSCULOSKELETAL: No joint pain or arthritis.   NEUROLOGIC: No tingling, numbness, weakness.  PSYCHIATRY: No anxiety or depression.   ROS  DRUG ALLERGIES:  No Known Allergies  VITALS:  Blood pressure 96/67, pulse 74, temperature 98.8 F (37.1 C), temperature source Bladder, resp. rate 19, height  (1.753 m), weight 68.1 kg (150 lb 2.1 oz), SpO2 100 %.  PHYSICAL EXAMINATION:  GENERAL:  62 y.o.-year-old patient lying in the bed with no acute distress.  EYES: Pupils equal, round, reactive to light and accommodation. No scleral icterus. Extraocular muscles intact.  HEENT: Head atraumatic, normocephalic. Oropharynx and nasopharynx clear.  NECK:  Supple, no jugular venous distention. No thyroid enlargement, no tenderness.  LUNGS: Normal breath sounds bilaterally, no wheezing, rales,rhonchi or crepitation. No use of accessory muscles of respiration.  CARDIOVASCULAR: S1, S2 normal. No murmurs, rubs, or gallops.  ABDOMEN: Soft, nontender, nondistended. Bowel sounds present. No organomegaly or mass.  EXTREMITIES: No pedal edema,  cyanosis, or clubbing.  NEUROLOGIC: Cranial nerves II through XII are intact. Muscle strength 5/5 in all extremities. Sensation intact. Gait not checked.  PSYCHIATRIC: The patient is alert and oriented x 3.  SKIN: No obvious rash, lesion, or ulcer.   Physical Exam LABORATORY PANEL:   CBC Recent Labs  Lab 04/08/18 0404  WBC 24.1*  HGB 14.0  HCT 41.0  PLT 69*   ------------------------------------------------------------------------------------------------------------------  Chemistries  Recent Labs  Lab 04/08/18 0404 04/08/18 0810  NA  --  142  K  --  4.9  CL  --  94*  CO2  --  35*  GLUCOSE  --  108*  BUN  --  57*  CREATININE  --  4.24*  CALCIUM  --  6.5*  MG  --  1.9  AST 2,546*  --   ALT 1,860*  --   ALKPHOS 55  --   BILITOT 1.1  --    ------------------------------------------------------------------------------------------------------------------  Cardiac Enzymes Recent Labs  Lab 04/07/18 1545 04/08/18 0031  TROPONINI >65.00* >65.00*   ------------------------------------------------------------------------------------------------------------------  RADIOLOGY:  Dg Chest 1 View  Result Date: 04/07/2018 CLINICAL DATA:  Endotracheal tube placement EXAM: CHEST  1 VIEW COMPARISON:  04-27-18 FINDINGS: Endotracheal tube has been placed with tip measuring 3.4 cm above the carina. Right central venous catheter with tip over the mid SVC region. No pneumothorax. Enteric tube tip is off the field of view but below the left hemidiaphragm. Tubing over the left chest consistent with ventricular peritoneal shunt tubing. Cardiac enlargement with mild pulmonary vascular congestion. No consolidation or edema. No blunting of costophrenic angles. Mediastinal contours appear intact. IMPRESSION: Appliances appear in satisfactory position. Cardiac enlargement. Mild pulmonary vascular congestion. No edema or consolidation. Electronically Signed  By: Burman Nieves M.D.   On:  04/07/2018 00:31   Dg Abd 1 View  Result Date: 04/07/2018 CLINICAL DATA:  OG tube placement EXAM: ABDOMEN - 1 VIEW COMPARISON:  04/03/2018 FINDINGS: Enteric tube tip is in the left mid abdomen consistent with location in the distal stomach. Visualized colon is not abnormally distended. Ventricular peritoneal shunt tubing is present. IMPRESSION: Enteric tube tip is in the left mid abdomen consistent with location in the distal stomach. Electronically Signed   By: Burman Nieves M.D.   On: 04/07/2018 00:32   Ct Head Wo Contrast  Result Date: 04/05/2018 CLINICAL DATA:  Altered LOC, cardiac arrest EXAM: CT HEAD WITHOUT CONTRAST TECHNIQUE: Contiguous axial images were obtained from the base of the skull through the vertex without intravenous contrast. COMPARISON:  None. FINDINGS: Brain: No acute territorial infarction or hemorrhage is visualized. Mild encephalomalacia in the left frontal lobe adjacent to ventricular catheter. Left frontal catheter terminates at the midline posteriorly, just above the pineal region. Slight asymmetric appearance of the ventricles, right larger than left but no ventricular enlargement. Possible small focus of encephalomalacia in the left posterior temporal lobe. Vascular: No hyperdense vessels. Scattered calcifications at the carotid siphons. Skull: No fracture Sinuses/Orbits: Mild mucosal thickening in the ethmoid sinuses. No acute orbital abnormality Other: None IMPRESSION: 1. No definite CT evidence for acute intracranial abnormality. 2. Left frontal ventricular catheter with adjacent encephalomalacia in the frontal lobe. Slight asymmetric appearance of ventricles, right larger than left but without hydrocephalus. Electronically Signed   By: Jasmine Pang M.D.   On: 03/24/2018 23:53   Dg Chest Port 1 View  Result Date: 04/08/2018 CLINICAL DATA:  CHF, intubated patient, malnutrition, HIV. EXAM: PORTABLE CHEST 1 VIEW COMPARISON:  Portable chest x-ray of April 06, 2018 FINDINGS:  The lungs are mildly hyperinflated. The perihilar lung markings are increased and more conspicuous today. External pacemaker defibrillator pads are present. The heart is top normal in size. The pulmonary vascularity is engorged and indistinct. The endotracheal tube tip projects approximately 3 cm above the carina. The esophagogastric tube tip and proximal port project below the GE junction. The right internal jugular venous catheter tip projects over the proximal SVC. A ventriculoperitoneal shunt tube is visible to the left of midline. IMPRESSION: CHF with interstitial edema much more conspicuous than on yesterday's study. Probable posterior layering of pleural effusions. The support tubes and devices are in reasonable position. Electronically Signed   By: David  [place] M.D.   On: 04/08/2018 09:07   Dg Chest Portable 1 View  Result Date: 04/01/2018 CLINICAL DATA:  Post code EXAM: PORTABLE CHEST 1 VIEW COMPARISON:  None. FINDINGS: Endotracheal tube tip is about 6.1 cm superior to carina. Esophageal tube tip projects over the distal esophagus. Right IJ central venous catheter tip over the SVC. No pneumothorax. No focal opacity or pleural effusion. Mild cardiomegaly. Probable left-sided shunt catheter. IMPRESSION: 1. Endotracheal tube tip about 6.1 cm superior to carina 2. Esophageal tube tip overlies the distal esophagus, further advancement recommended for more optimal positioning. 3. Mild cardiomegaly without acute infiltrate 4. Right IJ central venous catheter tip over the SVC without pneumothorax. Electronically Signed   By: Jasmine Pang M.D.   On: 03/08/2018 22:23   Dg Abd Portable 1 View  Result Date: 03/27/2018 CLINICAL DATA:  OG tube placement EXAM: PORTABLE ABDOMEN - 1 VIEW COMPARISON:  None. FINDINGS: The tip of the esophageal tube may be visible near the distal esophagus. Mild gaseous enlargement of stomach. Left-sided shunt  tubing courses toward the pelvis but is incompletely imaged. Nonobstructed  gas pattern. IMPRESSION: Esophageal tube tip appears to overlie the distal esophagus. Further advancement recommended for more optimal positioning Electronically Signed   By: Jasmine Pang M.D.   On: 03/09/2018 22:24    ASSESSMENT AND PLAN:  1.Acute respiratory failure,status post cardiac arrest.  Prolonged resuscitation effort, continue full vent support, management as as per ICU physician.  Patient still requiring FiO2 of 80%.-Continue to keep.  2.Cardiac arrest,possibly secondary to V. Tach episode.Continue to monitor on telemetry.Cardiology is consulted for further evaluation and treatment.  Patient troponin up at 65, followed by cardiology Dr. Adrian Blackwater.  Follow echocardiogram, patient to unstable to have any cardiac evaluation.  Possible torsades, V. fib arrest, r status post shock 11 times.Marland Kitchen  3.Cardiogenic shock.Continue vent support and pressor support.  4.Elevated troponin levels,that is post cardiac arrest.We will continue to monitor troponin levels.Cardiology is consulted for further recommendations.,  Patient has a history of left ventricular mural thrombus as per his medical records in care everywhere, echocardiogram in 2018 March showed EF is around 10%.  Patient had echo done but results are pending.Marland Kitchen 5.Acute metabolic acidosis,status post cardiac arrest and acute respiratory failure.On bicarb drip.Continue to monitor closely in intensive care unit. 6.Acute renal failure,likely ATN, secondary to hypotension.We will continue IV fluids and monitor kidney function closely.Avoid nephrotoxic medications. 7.Acute encephalopathy,status post cardiac arrest and acute respiratory failure.There is high likelihood for anoxic brain injury.  Started on Keppra for myoclonic jerks, EEG is pending.  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 Management plans discussed with the patient, family and they are in agreement.  CODE STATUS: dnr  TOTAL TIME TAKING CARE OF THIS PATIENT: 35 minutes.     POSSIBLE D/C IN 2-5 DAYS, DEPENDING ON CLINICAL CONDITION.   Evelena Asa Rand Etchison M.D on 04/08/2018   Between 7am to 6pm - Pager - [phone]  After 6pm go to [email or web address] - password [email or web address]  [facility]  Office  [phone]  CC: Primary care physician; Patient, No Pcp Per  Note: This dictation was prepared with Dragon dictation along with smaller phrase technology. Any transcriptional errors that result from this process are unintentional.

## 2018-04-08 NOTE — Progress Notes (Signed)
SUBJECTIVE: Patient is sedated , intubated  Vitals:   04/08/18 0600 04/08/18 0700 04/08/18 0731 04/08/18 0800  BP: 102/69 109/72    Pulse: 69 67    Resp: 20 (!) 23    Temp: 98.2 F (36.8 C) 99 F (37.2 C)  99.1 F (37.3 C)  TempSrc: Bladder Bladder  Bladder  SpO2: 100% 100% 100% 100%  Weight:      Height:        Intake/Output Summary (Last 24 hours) at 04/08/2018 0942 Last data filed at 04/08/2018 0900 Gross per 24 hour  Intake 3983.62 ml  Output 91 ml  Net 3892.62 ml    LABS: Basic Metabolic Panel: Recent Labs    04/07/18 1551 04/08/18 0031  NA 144 140  K 5.0 5.8*  CL 102 98*  CO2 26 29  GLUCOSE 112* 183*  BUN 39* 49*  CREATININE 3.18* 3.74*  CALCIUM 6.9* 6.3*  MG 2.1 2.1  PHOS 6.7* 4.6   Liver Function Tests: Recent Labs    17-Apr-2018 2146 04/08/18 0404  AST 972* 2,546*  ALT 944* 1,860*  ALKPHOS 81 55  BILITOT 1.4* 1.1  PROT 6.2* 4.5*  ALBUMIN 3.7 2.6*   No results for input(s): LIPASE, AMYLASE in the last 72 hours. CBC: Recent Labs    04-17-2018 2146 04/07/18 0353 04/08/18 0404  WBC 15.4* 21.5* 24.1*  NEUTROABS 13.6*  --   --   HGB 14.5 15.3 14.0  HCT 44.1 44.9 41.0  MCV 106.7* 106.6* 102.9*  PLT 143* 117* 69*   Cardiac Enzymes: Recent Labs    04/07/18 0850 04/07/18 1545 04/08/18 0031  CKTOTAL 23,390*  --   --   TROPONINI >65.00* >65.00* >65.00*   BNP: Invalid input(s): POCBNP D-Dimer: No results for input(s): DDIMER in the last 72 hours. Hemoglobin A1C: No results for input(s): HGBA1C in the last 72 hours. Fasting Lipid Panel: Recent Labs    2018/04/17 2146  CHOL 199  HDL 68  LDLCALC 116*  TRIG 75  CHOLHDL 2.9   Thyroid Function Tests: No results for input(s): TSH, T4TOTAL, T3FREE, THYROIDAB in the last 72 hours.  Invalid input(s): FREET3 Anemia Panel: No results for input(s): VITAMINB12, FOLATE, FERRITIN, TIBC, IRON, RETICCTPCT in the last 72 hours.   PHYSICAL EXAM General: Well developed, well nourished, in no acute  distress HEENT:  Normocephalic and atramatic Neck:  No JVD.  Lungs: Clear bilaterally to auscultation and percussion. Heart: HRRR . Normal S1 and S2 without gallops or murmurs.  Abdomen: Bowel sounds are positive, abdomen soft and non-tender  Msk:  Back normal, normal gait. Normal strength and tone for age. Extremities: No clubbing, cyanosis or edema.   Neuro: Alert and oriented X 3. Psych:  Good affect, responds appropriately  TELEMETRY: NSR 80/min  ASSESSMENT AND PLAN: NSTEMI/Carddiogenic shock/Respiratory failure and s/p cardiac Arrest due Ventricular tachycardia/HIV positive/DNR. Patient remains sedated and unclear of neurological status and  Not candidate for cath as comfort care now.   Active Problems:   Cardiac arrest (HCC)   Myoclonus   Malnutrition of moderate degree    Brian Boyle A, MD, [facility] 04/08/2018 9:42 AM

## 2018-04-08 NOTE — Consult Note (Signed)
Date: 04/08/2018                  Patient Name:  Heinrich Fertig  MRN: [id number]  DOB: 01/06/1956  Age / Sex: 62 y.o., male         PCP: Patient, No Pcp Per                 Service Requesting Consult: IM/ Salary, Avel Peace, MD                 Reason for Consult: ARF            History of Present Illness: Patient is a 62 y.o. male with medical problems of HIV/AIDS, who was admitted to [facility] on 03/10/2018 post cardiac arrest at home.   Patient had a cardiac arrest witnessed at home by his mother.  Approximate down time 4 min.before CPR was started by EMS.  Patient was found to be in torsades and then V. Tach.  Shock 11 times and given multiple rounds of medications.  Patient is now intubated requiring ventilatory support.  He has also developed severe acute kidney injury and is oliguric area nephrology consult has been requested for evaluation According to care everywhere records, baseline creatinine 1.39, GFR 52 from February 2019  Medications: Outpatient medications: Medications Prior to Admission  Medication Sig Dispense Refill Last Dose  . BIKTARVY 50-200-25 MG TABS tablet Take 1 tablet by mouth daily.  5 Unknown at Unknown  . cetirizine (ZYRTEC) 5 MG tablet Take 10 mg by mouth daily.  11 Unknown at Unknown  . ELIQUIS 2.5 MG TABS tablet Take 1 tablet by mouth 2 (two) times daily.  10 Unknown at Unknown  . fluticasone (FLONASE) 50 MCG/ACT nasal spray Place 1 spray into both nostrils daily.  1 Unknown at Unknown  . furosemide (LASIX) 40 MG tablet Take 1 tablet by mouth daily.  11 Unknown at Unknown  . LORazepam (ATIVAN) 0.5 MG tablet Take 2 tablets by mouth daily as needed.  3 Unknown at Unknown  . losartan (COZAAR) 25 MG tablet Take 12.5 mg by mouth daily.   Unknown at Unknown  . metoprolol succinate (TOPROL-XL) 25 MG 24 hr tablet Take 1 tablet by mouth daily.  8 Unknown at Unknown  . Multiple Vitamin (MULTIVITAMIN) capsule Take 1 capsule by mouth 4 (four) times daily.   Unknown at  Unknown  . polyethylene glycol (MIRALAX / GLYCOLAX) packet Take 17 g by mouth daily as needed.   Unknown at Unknown  . valACYclovir (VALTREX) 1000 MG tablet Take 2 tablets (2000MG) by mouth at onset of symptoms and repeat after 12 hours  5 Unknown at Unknown    Current medications: Current Facility-Administered Medications  Medication Dose Route Frequency Provider Last Rate Last Dose  . 0.9 %  sodium chloride infusion   Intravenous Continuous Harvest Dark, MD   Stopped at 04/07/18 2000  . acetaminophen (TYLENOL) tablet 650 mg  650 mg Oral Q6H PRN Amelia Jo, MD       Or  . acetaminophen (TYLENOL) suppository 650 mg  650 mg Rectal Q6H PRN Amelia Jo, MD      . aspirin chewable tablet 324 mg  324 mg Oral Once Harvest Dark, MD   Stopped at 03/31/2018 2144  . chlorhexidine gluconate (MEDLINE KIT) (PERIDEX) 0.12 % solution 15 mL  15 mL Mouth Rinse BID Awilda Bill, NP   15 mL at 04/08/18 0854  . docusate sodium (COLACE) capsule 100 mg  100  mg Oral BID Amelia Jo, MD      . DOPamine (INTROPIN) 800 mg in dextrose 5 % 250 mL (3.2 mg/mL) infusion  2-20 mcg/kg/min Intravenous Continuous Awilda Bill, NP 6.8 mL/hr at 04/08/18 1030 6 mcg/kg/min at 04/08/18 1030  . famotidine (PEPCID) IVPB 20 mg premix  20 mg Intravenous Q24H Awilda Bill, NP   Stopped at 04/08/18 0035  . feeding supplement (VITAL HIGH PROTEIN) liquid 1,000 mL  1,000 mL Per Tube Q24H Samaan, Maged, MD      . fentaNYL (SUBLIMAZE) bolus via infusion 50 mcg  50 mcg Intravenous Q1H PRN Awilda Bill, NP      . fentaNYL 2574mg in NS 2522m(1022mml) infusion-PREMIX  25-400 mcg/hr Intravenous Continuous BlaAwilda BillP   Stopped at 04/08/18 0900  . heparin ADULT infusion 100 units/mL (25000 units/250m92mdium chloride 0.45%)  400 Units/hr Intravenous Continuous BlakAwilda Bill   Stopped at 04/08/18 1117  . hydrocortisone sodium succinate (SOLU-CORTEF) 100 MG injection 50 mg  50 mg Intravenous Q8H  Samaan, Maged, MD   50 mg at 04/08/18 0929  . insulin aspart (novoLOG) injection 0-9 Units  0-9 Units Subcutaneous Q4H Samaan, Maged, MD      . levETIRAcetam (KEPPRA) 750 mg in sodium chloride 0.9 % 100 mL IVPB  750 mg Intravenous Q12H BlakAwilda Bill   Stopped at 04/07/18 2341  . MEDLINE mouth rinse  15 mL Mouth Rinse 10 times per day BlakAwilda Bill   15 mL at 04/08/18 0956  . midazolam (VERSED) 50 mg in sodium chloride 0.9 % 50 mL (1 mg/mL) infusion  0.5-10 mg/hr Intravenous Continuous BlakAwilda Bill 4 mL/hr at 04/08/18 0700 4 mg/hr at 04/08/18 0700  . norepinephrine (LEVOPHED) 16 mg in dextrose 5 % 250 mL (0.064 mg/mL) infusion  0-40 mcg/min Intravenous Continuous BlakAwilda Bill 7.5 mL/hr at 04/08/18 1117 8 mcg/min at 04/08/18 1117  . phenylephrine (NEO-SYNEPHRINE) 40 mg in sodium chloride 0.9 % 250 mL (0.16 mg/mL) infusion  0-400 mcg/min Intravenous Titrated BlakAwilda Bill   Stopped at 04/07/18 0745  . piperacillin-tazobactam (ZOSYN) IVPB 3.375 g  3.375 g Intravenous Q12H KoniEpifanio Lesches   Stopped at 04/08/18 0522  . sodium chloride flush (NS) 0.9 % injection 10-40 mL  10-40 mL Intracatheter PRN KoniEpifanio Lesches      . vasopressin (PITRESSIN) 40 Units in sodium chloride 0.9 % 250 mL (0.16 Units/mL) infusion  0.03 Units/min Intravenous Continuous BlakAwilda Bill   Stopped at 04/07/18 [id number]  Allergies: No Known Allergies    Past Medical History: Past Medical History:  Diagnosis Date  . HIV (human immunodeficiency virus infection) (HCC)[place]   Past Surgical History: History reviewed. No pertinent surgical history.   Family History: No family history on file.   Social History: Social History   Socioeconomic History  . Marital status: Single    Spouse name: Not on file  . Number of children: Not on file  . Years of education: Not on file  . Highest education level: Not on file  Occupational History  . Not on file  Social  Needs  . Financial resource strain: Not on file  . Food insecurity:    Worry: Not on file    Inability: Not on file  . Transportation needs:    Medical: Not on file    Non-medical: Not on file  Tobacco Use  .  Smoking status: Not on file  Substance and Sexual Activity  . Alcohol use: Not on file  . Drug use: Not on file  . Sexual activity: Not on file  Lifestyle  . Physical activity:    Days per week: Not on file    Minutes per session: Not on file  . Stress: Not on file  Relationships  . Social connections:    Talks on phone: Not on file    Gets together: Not on file    Attends religious service: Not on file    Active member of club or organization: Not on file    Attends meetings of clubs or organizations: Not on file    Relationship status: Not on file  . Intimate partner violence:    Fear of current or ex partner: Not on file    Emotionally abused: Not on file    Physically abused: Not on file    Forced sexual activity: Not on file  Other Topics Concern  . Not on file  Social History Narrative  . Not on file     Review of Systems: Not available Gen:  HEENT:  CV:  Resp:  GI: GU :  MS:  Derm:   Psych: Heme:  Neuro:  Endocrine  Vital Signs: Blood pressure 91/62, pulse 70, temperature 98.8 F (37.1 C), temperature source Bladder, resp. rate 18, height 5' 9"  (1.753 m), weight 150 lb 2.1 oz (68.1 kg), SpO2 100 %.   Intake/Output Summary (Last 24 hours) at 04/08/2018 1131 Last data filed at 04/08/2018 1100 Gross per 24 hour  Intake 3702.03 ml  Output 91 ml  Net 3611.03 ml    Weight trends: Filed Weights   04/07/18 0042 04/07/18 0500 04/08/18 0500  Weight: 127 lb 10.3 oz (57.9 kg) 143 lb 15.4 oz (65.3 kg) 150 lb 2.1 oz (68.1 kg)    Physical Exam: General:  laying in the bed, critically ill-appearing  HEENT ET tube, OGT      Lungs: Ventilatory assisted.  FiO2 100%, PEEP 5  Heart::  tachycardic  Abdomen: Soft, decreased bowel sounds  Extremities:   trace to 1+ edema, cool to touch  Neurologic: nonresponsive to verbal or tactile stimuli  Skin: Hands and feet are cool to touch     Foley: In place       Lab results: Basic Metabolic Panel: Recent Labs  Lab 04/07/18 1551 04/08/18 0031 04/08/18 0810  NA 144 140 142  K 5.0 5.8* 4.9  CL 102 98* 94*  CO2 26 29 35*  GLUCOSE 112* 183* 108*  BUN 39* 49* 57*  CREATININE 3.18* 3.74* 4.24*  CALCIUM 6.9* 6.3* 6.5*  MG 2.1 2.1 1.9  PHOS 6.7* 4.6 4.3    Liver Function Tests: Recent Labs  Lab 04/08/18 0404  AST 2,546*  ALT 1,860*  ALKPHOS 55  BILITOT 1.1  PROT 4.5*  ALBUMIN 2.6*   No results for input(s): LIPASE, AMYLASE in the last 168 hours. No results for input(s): AMMONIA in the last 168 hours.  CBC: Recent Labs  Lab 03/14/2018 2146 04/07/18 0353 04/08/18 0404  WBC 15.4* 21.5* 24.1*  NEUTROABS 13.6*  --   --   HGB 14.5 15.3 14.0  HCT 44.1 44.9 41.0  MCV 106.7* 106.6* 102.9*  PLT 143* 117* 69*    Cardiac Enzymes: Recent Labs  Lab 04/07/18 0850  04/08/18 0031  CKTOTAL 23,390*  --   --   TROPONINI >65.00*   < > >65.00*   < > =  values in this interval not displayed.    BNP: Invalid input(s): POCBNP  CBG: Recent Labs  Lab 04/07/18 2316 04/07/18 2343 04/08/18 0404 04/08/18 0453 04/08/18 0806  GLUCAP 53* 220* 11* 106* 102*    Microbiology: Recent Results (from the past 720 hour(s))  MRSA PCR Screening     Status: None   Collection Time: 03/10/2018 11:45 PM  Result Value Ref Range Status   MRSA by PCR NEGATIVE NEGATIVE Final    Comment:        The GeneXpert MRSA Assay (FDA approved for NASAL specimens only), is one component of a comprehensive MRSA colonization surveillance program. It is not intended to diagnose MRSA infection nor to guide or monitor treatment for MRSA infections. Performed at [facility], [address], [place], [place] [address]   Urine Culture     Status: None   Collection Time: 03/10/2018 11:45 PM  Result  Value Ref Range Status   Specimen Description   Final    URINE, RANDOM Performed at [facility], [address] [place], [place] [address]    Special Requests   Final    NONE Performed at [facility], [address] [place], [place] [address]    Culture   Final    NO GROWTH Performed at [facility], [place] [address], [place], [place] [address]    Report Status 04/08/2018 FINAL  Final  CULTURE, BLOOD (ROUTINE X 2) w Reflex to ID Panel     Status: None (Preliminary result)   Collection Time: 04/07/18  3:54 AM  Result Value Ref Range Status   Specimen Description BLOOD LEFT ANTECUBITAL  Final   Special Requests   Final    BOTTLES DRAWN AEROBIC AND ANAEROBIC Blood Culture adequate volume   Culture   Final    NO GROWTH 1 DAY Performed at [facility], [address] [place], [place] [address]    Report Status PENDING  Incomplete  CULTURE, BLOOD (ROUTINE X 2) w Reflex to ID Panel     Status: None (Preliminary result)   Collection Time: 04/07/18  4:05 AM  Result Value Ref Range Status   Specimen Description BLOOD LEFT WRIST  Final   Special Requests   Final    BOTTLES DRAWN AEROBIC AND ANAEROBIC Blood Culture adequate volume   Culture   Final    NO GROWTH 1 DAY Performed at [facility], [address], [place], [place] [address]    Report Status PENDING  Incomplete  Culture, respiratory (NON-Expectorated)     Status: None (Preliminary result)   Collection Time: 04/07/18  5:36 AM  Result Value Ref Range Status   Specimen Description   Final    TRACHEAL ASPIRATE Performed at [facility], [address], [place], [place] [address]    Special Requests   Final    NONE Performed at [facility], [address] [place], [place] [address]    Gram Stain   Final    FEW WBC PRESENT, PREDOMINANTLY PMN FEW GRAM POSITIVE COCCI RARE GRAM NEGATIVE RODS    Culture   Final    CULTURE REINCUBATED FOR BETTER  GROWTH Performed at [facility], [place] [address], [place], [place] [address]    Report Status PENDING  Incomplete     Coagulation Studies: Recent Labs    03/19/2018 2145-02-16  LABPROT 20.6*  INR 1.79    Urinalysis: Recent Labs    04/07/18 [place]  LABSPEC 1.016  PHURINE 6.0  GLUCOSEU NEGATIVE  HGBUR LARGE*  BILIRUBINUR NEGATIVE  KETONESUR NEGATIVE  PROTEINUR 100*  NITRITE NEGATIVE  LEUKOCYTESUR NEGATIVE        Imaging: Dg Chest 1 View  Result Date: 04/07/2018 CLINICAL DATA:  Endotracheal tube placement EXAM: CHEST  1 VIEW COMPARISON:  04/04/2018 FINDINGS: Endotracheal tube has been placed with tip measuring 3.4 cm above the carina. Right central venous catheter with tip over the mid SVC region. No pneumothorax. Enteric tube tip is off the field of view but below the left hemidiaphragm. Tubing over the left chest consistent with ventricular peritoneal shunt tubing. Cardiac enlargement with mild pulmonary vascular congestion. No consolidation or edema. No blunting of costophrenic angles. Mediastinal contours appear intact. IMPRESSION: Appliances appear in satisfactory position. Cardiac enlargement. Mild pulmonary vascular congestion. No edema or consolidation. Electronically Signed   By: Lucienne Capers M.D.   On: 04/07/2018 00:31   Dg Abd 1 View  Result Date: 04/07/2018 CLINICAL DATA:  OG tube placement EXAM: ABDOMEN - 1 VIEW COMPARISON:  03/24/2018 FINDINGS: Enteric tube tip is in the left mid abdomen consistent with location in the distal stomach. Visualized colon is not abnormally distended. Ventricular peritoneal shunt tubing is present. IMPRESSION: Enteric tube tip is in the left mid abdomen consistent with location in the distal stomach. Electronically Signed   By: Lucienne Capers M.D.   On: 04/07/2018 00:32   Ct Head Wo Contrast  Result Date: 03/09/2018 CLINICAL DATA:  Altered LOC, cardiac arrest EXAM: CT HEAD WITHOUT CONTRAST TECHNIQUE: Contiguous  axial images were obtained from the base of the skull through the vertex without intravenous contrast. COMPARISON:  None. FINDINGS: Brain: No acute territorial infarction or hemorrhage is visualized. Mild encephalomalacia in the left frontal lobe adjacent to ventricular catheter. Left frontal catheter terminates at the midline posteriorly, just above the pineal region. Slight asymmetric appearance of the ventricles, right larger than left but no ventricular enlargement. Possible small focus of encephalomalacia in the left posterior temporal lobe. Vascular: No hyperdense vessels. Scattered calcifications at the carotid siphons. Skull: No fracture Sinuses/Orbits: Mild mucosal thickening in the ethmoid sinuses. No acute orbital abnormality Other: None IMPRESSION: 1. No definite CT evidence for acute intracranial abnormality. 2. Left frontal ventricular catheter with adjacent encephalomalacia in the frontal lobe. Slight asymmetric appearance of ventricles, right larger than left but without hydrocephalus. Electronically Signed   By: Donavan Foil M.D.   On: 03/31/2018 23:53   Dg Chest Port 1 View  Result Date: 04/08/2018 CLINICAL DATA:  CHF, intubated patient, malnutrition, HIV. EXAM: PORTABLE CHEST 1 VIEW COMPARISON:  Portable chest x-ray of April 06, 2018 FINDINGS: The lungs are mildly hyperinflated. The perihilar lung markings are increased and more conspicuous today. External pacemaker defibrillator pads are present. The heart is top normal in size. The pulmonary vascularity is engorged and indistinct. The endotracheal tube tip projects approximately 3 cm above the carina. The esophagogastric tube tip and proximal port project below the GE junction. The right internal jugular venous catheter tip projects over the proximal SVC. A ventriculoperitoneal shunt tube is visible to the left of midline. IMPRESSION: CHF with interstitial edema much more conspicuous than on yesterday's study. Probable posterior layering of  pleural effusions. The support tubes and devices are in reasonable position. Electronically Signed   By: David  Martinique M.D.   On: 04/08/2018 09:07   Dg Chest Portable 1 View  Result Date: 03/14/2018 CLINICAL DATA:  Post code EXAM: PORTABLE CHEST 1 VIEW COMPARISON:  None. FINDINGS: Endotracheal tube tip is about 6.1 cm superior to carina. Esophageal tube tip projects over the distal esophagus. Right IJ central venous catheter tip over the SVC. No pneumothorax. No focal opacity or pleural effusion. Mild cardiomegaly. Probable left-sided shunt catheter. IMPRESSION: 1. Endotracheal tube tip about 6.1 cm superior to carina 2. Esophageal tube tip overlies the distal esophagus, further advancement recommended for more optimal positioning. 3. Mild cardiomegaly without acute infiltrate 4. Right IJ central venous catheter tip over the SVC without pneumothorax. Electronically Signed   By: Donavan Foil M.D.   On: 03/09/2018 22:23   Dg Abd Portable 1 View  Result Date: 04/04/2018 CLINICAL DATA:  OG tube placement EXAM: PORTABLE ABDOMEN - 1 VIEW COMPARISON:  None. FINDINGS: The tip of the esophageal tube may be visible near the distal esophagus. Mild gaseous enlargement of stomach. Left-sided shunt tubing courses toward the pelvis but is incompletely imaged. Nonobstructed gas pattern. IMPRESSION: Esophageal tube tip appears to overlie the distal esophagus. Further advancement recommended for more optimal positioning Electronically Signed   By: Donavan Foil M.D.   On: 04/01/2018 22:24      Assessment & Plan: Pt is a 62 y.o. caucasian  male with a PMHX of hIV/AIDS, chronic systolic congestive heart failure, left windblown mural thrombus, anxiety/depression, cryptococcal meningitis history, melanoma, was admitted on 03/14/2018   1.  Acute renal failure,oliguric 2.  Acute respiratory failure 3.  Acute MI, cardiogenic shock  Patient is critically ill requiring ventilatory and hemodynamic support.  He has  cardiogenic shock due to acute MI.  Not a candidate for heart catheterization due to unclear neurological prognosis.  At present, there is no acute indication for dialysis.  Will continue to monitor and follow along closely.  Further recommendations as per hospital course.       LOS: [place] 5/2/201911:31 AM  [place], [place]  Note: This note was prepared with Dragon dictation. Any transcription errors are unintentional

## 2018-04-08 NOTE — Progress Notes (Addendum)
ANTICOAGULATION CONSULT NOTE  Pharmacy Consult for heparin drip Indication: chest pain/ACS  No Known Allergies  Patient Measurements: Height:  (175.3 cm) Weight: 150 lb 2.1 oz (68.1 kg) IBW/kg (Calculated) : 70.7 Heparin Dosing Weight: 58 kg  Vital Signs:  Temp: 97.3 F (36.3 C) (05/02 0500) Temp Source: Bladder (05/02 0500) BP: 107/74 (05/02 0500) Pulse Rate: 60 (05/02 0500)  Labs: Recent Labs    04/25/2018 2146 04/07/18 0108 04/07/18 0353 04/07/18 0850 04/07/18 1545 04/07/18 1551 04/07/18 1810 04/08/18 0031 04/08/18 0200 04/08/18 0404  HGB 14.5  --  15.3  --   --   --   --   --   --  14.0  HCT 44.1  --  44.9  --   --   --   --   --   --  41.0  PLT 143*  --  117*  --   --   --   --   --   --  69*  APTT 39*  --   --  114*  --   --  113*  --  107*  --   LABPROT 20.6*  --   --   --   --   --   --   --   --   --   INR 1.79  --   --   --   --   --   --   --   --   --   HEPARINUNFRC  --  1.07*  --  1.05*  --   --   --   --   --  0.80*  CREATININE 2.71*  --  2.63* 3.11*  --  3.18*  --  3.74*  --   --   CKTOTAL  --   --   --  23,390*  --   --   --   --   --   --   TROPONINI 17.60*  --  >65.00* >65.00* >65.00*  --   --  >65.00*  --   --     Estimated Creatinine Clearance: 20.5 mL/min (A) (by C-G formula based on SCr of 3.74 mg/dL (H)).   Medical History: Past Medical History:  Diagnosis Date  . HIV (human immunodeficiency virus infection) (HCC)     Medications:  On Eliquis PTA   Assessment: 62 y/o M with a h/o mural thrombus admitted to ICU on TTM protocol after cardiac arrest.   Goal of Therapy:  Heparin level 0.3-0.7 units/ml  APTT 66-102 Monitor platelets by anticoagulation protocol: Yes   Plan:  5/1 1810 aPTT 113. Level is supratherapeutic. Will decrease infusion to  500u/hr and recheck aPTT in 6 hours. CBC with AM labs per protocol.   5/2 0200 aPTT 107, 0400 HL 0.80. Decrease to 400 units/hr and recheck aPTT in 8 hours.  Erich Montane, PharmD,  BCPS Clinical Pharmacist 04/08/2018 5:18 AM

## 2018-04-09 ENCOUNTER — Inpatient Hospital Stay: Payer: Medicare HMO

## 2018-04-09 LAB — BLOOD GAS, ARTERIAL
ACID-BASE DEFICIT: 16.3 mmol/L — AB (ref 0.0–2.0)
ACID-BASE EXCESS: 7.8 mmol/L — AB (ref 0.0–2.0)
ACID-BASE EXCESS: 4.6 mmol/L — AB (ref 0.0–2.0)
BICARBONATE: 32.7 mmol/L — AB (ref 20.0–28.0)
BICARBONATE: 28.3 mmol/L — AB (ref 20.0–28.0)
Bicarbonate: 10.3 mmol/L — ABNORMAL LOW (ref 20.0–28.0)
FIO2: 1
FIO2: 0.5
FIO2: 0.3
LHR: 16 {breaths}/min
O2 SAT: 90.9 %
O2 Saturation: 99.8 %
O2 Saturation: 98.8 %
PATIENT TEMPERATURE: 37
PATIENT TEMPERATURE: 37
PCO2 ART: 46 mmHg (ref 32.0–48.0)
PCO2 ART: 38 mmHg (ref 32.0–48.0)
PEEP/CPAP: 5 cmH2O
PEEP: 5 cmH2O
PH ART: 7.46 — AB (ref 7.350–7.450)
PO2 ART: 56 mmHg — AB (ref 83.0–108.0)
Patient temperature: 37
RATE: 20 resp/min
VT: 500 mL
VT: 500 mL
pCO2 arterial: 27 mmHg — ABNORMAL LOW (ref 32.0–48.0)
pH, Arterial: 7.48 — ABNORMAL HIGH (ref 7.350–7.450)
pO2, Arterial: 117 mmHg — ABNORMAL HIGH (ref 83.0–108.0)

## 2018-04-09 LAB — BASIC METABOLIC PANEL
Anion gap: 14 (ref 5–15)
BUN: 71 mg/dL — AB (ref 6–20)
CHLORIDE: 97 mmol/L — AB (ref 101–111)
CO2: 31 mmol/L (ref 22–32)
Calcium: 6.5 mg/dL — ABNORMAL LOW (ref 8.9–10.3)
Creatinine, Ser: 5.21 mg/dL — ABNORMAL HIGH (ref 0.61–1.24)
GFR calc Af Amer: 13 mL/min — ABNORMAL LOW (ref >60)
GFR calc non Af Amer: 11 mL/min — ABNORMAL LOW (ref >60)
GLUCOSE: 123 mg/dL — AB (ref 65–99)
POTASSIUM: 5.8 mmol/L — AB (ref 3.5–5.1)
Sodium: 142 mmol/L (ref 135–145)

## 2018-04-09 LAB — HEPATIC FUNCTION PANEL
ALBUMIN: 2.6 g/dL — AB (ref 3.5–5.0)
ALK PHOS: 65 U/L (ref 38–126)
ALT: 1675 U/L — ABNORMAL HIGH (ref 17–63)
AST: 1595 U/L — ABNORMAL HIGH (ref 15–41)
Bilirubin, Direct: 0.4 mg/dL (ref 0.1–0.5)
Indirect Bilirubin: 1 mg/dL — ABNORMAL HIGH (ref 0.3–0.9)
Total Bilirubin: 1.4 mg/dL — ABNORMAL HIGH (ref 0.3–1.2)
Total Protein: 4.6 g/dL — ABNORMAL LOW (ref 6.5–8.1)

## 2018-04-09 LAB — CBC WITH DIFFERENTIAL/PLATELET
BASOS PCT: 0 %
Basophils Absolute: 0 10*3/uL (ref 0–0.1)
EOS ABS: 0 10*3/uL (ref 0–0.7)
EOS PCT: 0 %
HCT: 37.8 % — ABNORMAL LOW (ref 40.0–52.0)
HEMOGLOBIN: 13.1 g/dL (ref 13.0–18.0)
Lymphocytes Relative: 2 %
Lymphs Abs: 0.4 10*3/uL — ABNORMAL LOW (ref 1.0–3.6)
MCH: 36 pg — ABNORMAL HIGH (ref 26.0–34.0)
MCHC: 34.6 g/dL (ref 32.0–36.0)
MCV: 103.8 fL — ABNORMAL HIGH (ref 80.0–100.0)
MONOS PCT: 3 %
Monocytes Absolute: 0.7 10*3/uL (ref 0.2–1.0)
NEUTROS PCT: 95 %
Neutro Abs: 19.6 10*3/uL — ABNORMAL HIGH (ref 1.4–6.5)
Platelets: 52 10*3/uL — ABNORMAL LOW (ref 150–440)
RBC: 3.65 MIL/uL — ABNORMAL LOW (ref 4.40–5.90)
RDW: 13.6 % (ref 11.5–14.5)
WBC: 20.7 10*3/uL — AB (ref 3.8–10.6)

## 2018-04-09 LAB — COMPREHENSIVE METABOLIC PANEL
ALBUMIN: 2.7 g/dL — AB (ref 3.5–5.0)
ALK PHOS: 66 U/L (ref 38–126)
ALT: 1654 U/L — AB (ref 17–63)
AST: 1602 U/L — AB (ref 15–41)
Anion gap: 14 (ref 5–15)
BUN: 75 mg/dL — AB (ref 6–20)
CALCIUM: 6.4 mg/dL — AB (ref 8.9–10.3)
CHLORIDE: 98 mmol/L — AB (ref 101–111)
CO2: 29 mmol/L (ref 22–32)
CREATININE: 5.24 mg/dL — AB (ref 0.61–1.24)
GFR calc non Af Amer: 11 mL/min — ABNORMAL LOW (ref >60)
GFR, EST AFRICAN AMERICAN: 13 mL/min — AB (ref >60)
GLUCOSE: 136 mg/dL — AB (ref 65–99)
Potassium: 5.6 mmol/L — ABNORMAL HIGH (ref 3.5–5.1)
SODIUM: 141 mmol/L (ref 135–145)
Total Bilirubin: 1.3 mg/dL — ABNORMAL HIGH (ref 0.3–1.2)
Total Protein: 4.8 g/dL — ABNORMAL LOW (ref 6.5–8.1)

## 2018-04-09 LAB — GLUCOSE, CAPILLARY
GLUCOSE-CAPILLARY: 102 mg/dL — AB (ref 65–99)
GLUCOSE-CAPILLARY: 104 mg/dL — AB (ref 65–99)
GLUCOSE-CAPILLARY: 91 mg/dL (ref 65–99)
Glucose-Capillary: 111 mg/dL — ABNORMAL HIGH (ref 65–99)
Glucose-Capillary: 113 mg/dL — ABNORMAL HIGH (ref 65–99)
Glucose-Capillary: 114 mg/dL — ABNORMAL HIGH (ref 65–99)
Glucose-Capillary: 123 mg/dL — ABNORMAL HIGH (ref 65–99)
Glucose-Capillary: 62 mg/dL — ABNORMAL LOW (ref 65–99)
Glucose-Capillary: 88 mg/dL (ref 65–99)
Glucose-Capillary: 94 mg/dL (ref 65–99)

## 2018-04-09 LAB — PHOSPHORUS
Phosphorus: 6.6 mg/dL — ABNORMAL HIGH (ref 2.5–4.6)
Phosphorus: 6.8 mg/dL — ABNORMAL HIGH (ref 2.5–4.6)

## 2018-04-09 LAB — POTASSIUM
POTASSIUM: 5.7 mmol/L — AB (ref 3.5–5.1)
Potassium: 5.8 mmol/L — ABNORMAL HIGH (ref 3.5–5.1)

## 2018-04-09 LAB — MAGNESIUM
Magnesium: 2 mg/dL (ref 1.7–2.4)
Magnesium: 2.1 mg/dL (ref 1.7–2.4)

## 2018-04-09 LAB — CALCIUM, IONIZED
CALCIUM, IONIZED, SERUM: 3.5 mg/dL — AB (ref 4.5–5.6)
Calcium, Ionized, Serum: 3.5 mg/dL — ABNORMAL LOW (ref 4.5–5.6)
Calcium, Ionized, Serum: 3.4 mg/dL — ABNORMAL LOW (ref 4.5–5.6)

## 2018-04-09 MED ORDER — HYDROCORTISONE NA SUCCINATE PF 100 MG IJ SOLR
50.0000 mg | Freq: Two times a day (BID) | INTRAMUSCULAR | Status: DC
  Administered 2018-04-09 – 2018-04-10 (×2): 50 mg via INTRAVENOUS
  Filled 2018-04-09 (×2): qty 2

## 2018-04-09 MED ORDER — VITAL AF 1.2 CAL PO LIQD
1000.0000 mL | ORAL | Status: DC
  Administered 2018-04-09 – 2018-04-12 (×4): 1000 mL

## 2018-04-09 MED ORDER — FAMOTIDINE IN NACL 20-0.9 MG/50ML-% IV SOLN
20.0000 mg | Freq: Every day | INTRAVENOUS | Status: DC
  Administered 2018-04-09 – 2018-04-11 (×3): 20 mg via INTRAVENOUS
  Filled 2018-04-09 (×3): qty 50

## 2018-04-09 MED ORDER — DOCUSATE SODIUM 50 MG/5ML PO LIQD
100.0000 mg | Freq: Two times a day (BID) | ORAL | Status: DC
  Administered 2018-04-09 – 2018-04-11 (×4): 100 mg
  Filled 2018-04-09 (×4): qty 10

## 2018-04-09 NOTE — Plan of Care (Signed)
Pt remains sedated and intubated with arctic sun in place. Copious thick secretions from ETT. Still posturing with any stimulation. No urine output. Levo titrated down to 4.

## 2018-04-09 NOTE — Progress Notes (Signed)
[facility] [place], [place] 04/09/18  Subjective:   Patient remains critically ill, intubated requiring ventilator support Serum creatinine remains critically high at 5.2 FiO2 down to 30% Potassium is high at 5.6 Patient is oligo anuric with urine output of 15 cc Currently on Versed drip.  Very minimal response.  Nursing staff reports posturing.  Anoxic brain injury is highly suspected.  Objective:  Vital signs in last 24 hours:  Temp:  [98.2 F (36.8 C)-99.1 F (37.3 C)] 98.4 F (36.9 C) (05/03 1000) Pulse Rate:  [64-81] 67 (05/03 1000) Resp:  [9-19] 11 (05/03 1000) BP: (84-118)/(60-81) 93/68 (05/03 1000) SpO2:  [96 %-100 %] 96 % (05/03 1000) Arterial Line BP: (104-123)/(58-71) 106/62 (05/03 1000) FiO2 (%):  [30 %-100 %] 30 % (05/03 0843) Weight:  [159 lb 6.3 oz (72.3 kg)] 159 lb 6.3 oz (72.3 kg) (05/03 0500)  Weight change: 9 lb 4.2 oz (4.2 kg) Filed Weights   04/07/18 0500 04/08/18 0500 04/09/18 0500  Weight: 143 lb 15.4 oz (65.3 kg) 150 lb 2.1 oz (68.1 kg) 159 lb 6.3 oz (72.3 kg)    Intake/Output:    Intake/Output Summary (Last 24 hours) at 04/09/2018 1011 Last data filed at 04/09/2018 0600 Gross per 24 hour  Intake 2900.49 ml  Output 12 ml  Net 2888.49 ml     Physical Exam: General: Critically ill appearing  HEENT Pupils small round, conjunctival edema  Neck Rt IJ CVL  Pulm/lungs Vent assisted, Fio 30%, PEEP 5  CVS/Heart Regular, no rub  Abdomen:  soft  Extremities: Arm edema b/l  Neurologic: On versed but no response to verbal or tactile stimulation  Skin: Extremities are cold to touch          Basic Metabolic Panel:  Recent Labs  Lab 04/08/18 0031 04/08/18 0810 04/08/18 1650 04/09/18 0018 04/09/18 0459  NA 140 142 139 142 141  K 5.8* 4.9 6.0* 5.8* 5.6*  CL 98* 94* 95* 97* 98*  CO2 29 35* 30 31 29  GLUCOSE 183* 108* 115* 123* 136*  BUN 49* 57* 65* 71* 75*  CREATININE 3.74* 4.24* 4.83* 5.21* 5.24*  CALCIUM 6.3* 6.5* 6.3*  6.5* 6.4*  MG 2.1 1.9 2.0 2.1 2.0  PHOS 4.6 4.3 5.5* 6.8* 6.6*     CBC: Recent Labs  Lab 03/17/2018 2146 04/07/18 0353 04/08/18 0404 04/09/18 0459  WBC 15.4* 21.5* 24.1* 20.7*  NEUTROABS 13.6*  --   --  19.6*  HGB 14.5 15.3 14.0 13.1  HCT 44.1 44.9 41.0 37.8*  MCV 106.7* 106.6* 102.9* 103.8*  PLT 143* 117* 69* 52*     No results found for: HEPBSAG, HEPBSAB, HEPBIGM    Microbiology:  Recent Results (from the past 240 hour(s))  MRSA PCR Screening     Status: None   Collection Time: 03/14/2018 11:45 PM  Result Value Ref Range Status   MRSA by PCR NEGATIVE NEGATIVE Final    Comment:        The GeneXpert MRSA Assay (FDA approved for NASAL specimens only), is one component of a comprehensive MRSA colonization surveillance program. It is not intended to diagnose MRSA infection nor to guide or monitor treatment for MRSA infections. Performed at [facility], [address] [place], [place] [address]   Urine Culture     Status: None   Collection Time: 04/05/2018 11:45 PM  Result Value Ref Range Status   Specimen Description   Final    URINE, RANDOM Performed at [facility], [address] [place], [place] [address]    Special Requests   Final    NONE Performed at [facility], [address] [place], [place] [address]    Culture   Final    NO GROWTH Performed at [facility], [address] [place], [place] [address]    Report Status 04/08/2018 FINAL  Final  CULTURE, BLOOD (ROUTINE X 2) w Reflex to ID Panel     Status: None (Preliminary result)   Collection Time: 04/07/18  3:54 AM  Result Value Ref Range Status   Specimen Description BLOOD LEFT ANTECUBITAL  Final   Special Requests   Final    BOTTLES DRAWN AEROBIC AND ANAEROBIC Blood Culture adequate volume   Culture   Final    NO GROWTH 2 DAYS Performed at [facility], [address] [place], [place] [address]    Report Status PENDING  Incomplete  CULTURE,  BLOOD (ROUTINE X 2) w Reflex to ID Panel     Status: None (Preliminary result)   Collection Time: 04/07/18  4:05 AM  Result Value Ref Range Status   Specimen Description BLOOD LEFT WRIST  Final   Special Requests   Final    BOTTLES DRAWN AEROBIC AND ANAEROBIC Blood Culture adequate volume   Culture   Final    NO GROWTH 2 DAYS Performed at [facility], [address] [place], [place] [address]    Report Status PENDING  Incomplete  Culture, respiratory (NON-Expectorated)     Status: None (Preliminary result)   Collection Time: 04/07/18  5:36 AM  Result Value Ref Range Status   Specimen Description   Final    TRACHEAL ASPIRATE Performed at [facility], [address] [place], [place] [address]    Special Requests   Final    NONE Performed at  Hospital Lab, [address] , [place] [address]    Gram Stain   Final    FEW WBC PRESENT, PREDOMINANTLY PMN FEW GRAM POSITIVE COCCI RARE GRAM NEGATIVE RODS    Culture   Final    MODERATE STAPHYLOCOCCUS AUREUS SUSCEPTIBILITIES TO FOLLOW Performed at [facility], [address] [place],  [address]    Report Status PENDING  Incomplete    Coagulation Studies: Recent Labs    03/24/2018 2146  LABPROT 20.6*  INR 1.79    Urinalysis: Recent Labs    04/07/18 0040  COLORURINE AMBER*  LABSPEC 1.016  PHURINE 6.0  GLUCOSEU NEGATIVE  HGBUR LARGE*  BILIRUBINUR NEGATIVE  KETONESUR NEGATIVE  PROTEINUR 100*  NITRITE NEGATIVE  LEUKOCYTESUR NEGATIVE      Imaging: Dg Chest Port 1 View  Result Date: 04/09/2018 CLINICAL DATA:  Pneumonia. EXAM: PORTABLE CHEST 1 VIEW COMPARISON:  Radiograph of Apr 08, 2018. FINDINGS: Stable cardiomegaly. Endotracheal and nasogastric tubes are unchanged in position. Right internal jugular catheter is unchanged in position. Left pleural effusion appears to be smaller compared to prior exam with associated atelectasis. Stable right lower lobe opacity is noted  concerning for edema or pneumonia. Bony thorax is unremarkable. IMPRESSION: Stable support apparatus. Stable right basilar opacity. Improved left pleural effusion with associated atelectasis. Electronically Signed   By: James  Green Jr, M.D.   On: 04/09/2018 07:12   Dg Chest Port 1 View  Result Date: 04/08/2018 CLINICAL DATA:  CHF, intubated patient, malnutrition, HIV. EXAM: PORTABLE CHEST 1 VIEW COMPARISON:  Portable chest x-ray of April 06, 2018 FINDINGS: The lungs are mildly hyperinflated. The perihilar lung markings   are increased and more conspicuous today. External pacemaker defibrillator pads are present. The heart is top normal in size. The pulmonary vascularity is engorged and indistinct. The endotracheal tube tip projects approximately 3 cm above the carina. The esophagogastric tube tip and proximal port project below the GE junction. The right internal jugular venous catheter tip projects over the proximal SVC. A ventriculoperitoneal shunt tube is visible to the left of midline. IMPRESSION: CHF with interstitial edema much more conspicuous than on yesterday's study. Probable posterior layering of pleural effusions. The support tubes and devices are in reasonable position. Electronically Signed   By: David  Martinique M.D.   On: 04/08/2018 09:07     Medications:   . sodium chloride Stopped (04/07/18 2000)  . dextrose 5 % and 0.9% NaCl 125 mL/hr at 04/09/18 0359  . DOPamine Stopped (04/08/18 1826)  . famotidine (PEPCID) IV    . fentaNYL infusion INTRAVENOUS Stopped (04/08/18 0900)  . levETIRAcetam Stopped (04/09/18 0050)  . midazolam (VERSED) infusion 7 mg/hr (04/09/18 0609)  . norepinephrine (LEVOPHED) Adult infusion 2 mcg/min (04/09/18 0508)  . phenylephrine (NEO-SYNEPHRINE) Adult infusion Stopped (04/07/18 0745)  . piperacillin-tazobactam (ZOSYN)  IV Stopped (04/09/18 6270)  . vasopressin (PITRESSIN) infusion - *FOR SHOCK* Stopped (04/07/18 3500)   . chlorhexidine gluconate (MEDLINE KIT)   15 mL Mouth Rinse BID  . docusate sodium  100 mg Oral BID  . feeding supplement (VITAL HIGH PROTEIN)  1,000 mL Per Tube Q24H  . hydrocortisone sod succinate (SOLU-CORTEF) inj  50 mg Intravenous Q8H  . insulin aspart  0-9 Units Subcutaneous Q4H  . mouth rinse  15 mL Mouth Rinse 10 times per day   acetaminophen **OR** acetaminophen, fentaNYL, sodium chloride flush  Assessment/ Plan:  61 y.o. male  with a PMHX of HIV, chronic systolic congestive heart failure, left Vent mural thrombus, anxiety/depression, cryptococcal meningitis history, melanoma, was admitted on 04/05/2018   1.  Acute renal failure,oliguric 2.  Acute respiratory failure 3.  Acute MI, cardiogenic shock 4.  Hyperkalemia  Patient is critically ill requiring ventilatory and hemodynamic support.  He has cardiogenic shock due to acute MI.  Not a candidate for heart catheterization due to unclear neurological prognosis.  According to critical care notes, patient's family wants to hold off renal replacement therapy until neurological prognosis is more clear.  We will continue to follow closely.      LOS: [place] 5/3/201910:11 AM  [place], [place]  Note: This note was prepared with Dragon dictation. Any transcription errors are unintentional

## 2018-04-09 NOTE — Progress Notes (Signed)
[employer] [facility] [facility] [place] [facility]   PATIENT NAME: Brian Boyle    MR#:  [id number]  DATE OF BIRTH:  06/17/1956  SUBJECTIVE:  CHIEF COMPLAINT:   Chief Complaint  Patient presents with  . Cardiac Arrest  Patient continues to be comatose, case discussed with intensivist-patient remains critically ill with poor prognosis, neurology/cardiology input appreciated  REVIEW OF SYSTEMS:  CONSTITUTIONAL: No fever, fatigue or weakness.  EYES: No blurred or double vision.  EARS, NOSE, AND THROAT: No tinnitus or ear pain.  RESPIRATORY: No cough, shortness of breath, wheezing or hemoptysis.  CARDIOVASCULAR: No chest pain, orthopnea, edema.  GASTROINTESTINAL: No nausea, vomiting, diarrhea or abdominal pain.  GENITOURINARY: No dysuria, hematuria.  ENDOCRINE: No polyuria, nocturia,  HEMATOLOGY: No anemia, easy bruising or bleeding SKIN: No rash or lesion. MUSCULOSKELETAL: No joint pain or arthritis.   NEUROLOGIC: No tingling, numbness, weakness.  PSYCHIATRY: No anxiety or depression.   ROS  DRUG ALLERGIES:  No Known Allergies  VITALS:  Blood pressure 93/68, pulse 67, temperature 98.4 F (36.9 C), resp. rate 11, height  (1.753 m), weight 72.3 kg (159 lb 6.3 oz), SpO2 96 %.  PHYSICAL EXAMINATION:  GENERAL:  62 y.o.-year-old patient lying in the bed with no acute distress.  EYES: Pupils equal, round, reactive to light and accommodation. No scleral icterus. Extraocular muscles intact.  HEENT: Head atraumatic, normocephalic. Oropharynx and nasopharynx clear.  NECK:  Supple, no jugular venous distention. No thyroid enlargement, no tenderness.  LUNGS: Normal breath sounds bilaterally, no wheezing, rales,rhonchi or crepitation. No use of accessory muscles of respiration.  CARDIOVASCULAR: S1, S2 normal. No murmurs, rubs, or gallops.  ABDOMEN: Soft, nontender, nondistended. Bowel sounds present. No organomegaly or mass.  EXTREMITIES: No pedal edema, cyanosis, or clubbing.   NEUROLOGIC: Cranial nerves II through XII are intact. Muscle strength 5/5 in all extremities. Sensation intact. Gait not checked.  PSYCHIATRIC: The patient is alert and oriented x 3.  SKIN: No obvious rash, lesion, or ulcer.   Physical Exam LABORATORY PANEL:   CBC Recent Labs  Lab 04/09/18 0459  WBC 20.7*  HGB 13.1  HCT 37.8*  PLT 52*   ------------------------------------------------------------------------------------------------------------------  Chemistries  Recent Labs  Lab 04/09/18 0459  NA 141  K 5.6*  CL 98*  CO2 29  GLUCOSE 136*  BUN 75*  CREATININE 5.24*  CALCIUM 6.4*  MG 2.0  AST 1,595*  1,602*  ALT 1,675*  1,654*  ALKPHOS 65  66  BILITOT 1.4*  1.3*   ------------------------------------------------------------------------------------------------------------------  Cardiac Enzymes Recent Labs  Lab 04/07/18 1545 04/08/18 0031  TROPONINI >65.00* >65.00*   ------------------------------------------------------------------------------------------------------------------  RADIOLOGY:  Ct Head Wo Contrast  Result Date: 04/09/2018 CLINICAL DATA:  Cardiac arrest 3 days ago.  HIV EXAM: CT HEAD WITHOUT CONTRAST TECHNIQUE: Contiguous axial images were obtained from the base of the skull through the vertex without intravenous contrast. COMPARISON:  CT head 04-19-2018 FINDINGS: Brain: Left frontal ventricular shunt catheter unchanged in position in the midline posterior to the lateral ventricles. Slit-like left lateral ventricle unchanged. Right lateral ventricle not enlarged and unchanged. Hypodensity left frontal lobe at the site of catheter insertion is unchanged compatible with chronic encephalomalacia. No acute infarct, hemorrhage, or mass. Vascular: Negative for hyperdense vessel Skull: Negative Sinuses/Orbits: Mild mucosal edema paranasal sinuses.  Normal orbit Other: None IMPRESSION: Left frontal shunt catheter unchanged.  Negative for hydrocephalus.  Negative for acute infarct or hemorrhage. No evidence of hypoxic injury. Electronically Signed   By: Marlan Palau M.D.  On: 04/09/2018 11:41   Dg Chest Port 1 View  Result Date: 04/09/2018 CLINICAL DATA:  Pneumonia. EXAM: PORTABLE CHEST 1 VIEW COMPARISON:  Radiograph of Apr 08, 2018. FINDINGS: Stable cardiomegaly. Endotracheal and nasogastric tubes are unchanged in position. Right internal jugular catheter is unchanged in position. Left pleural effusion appears to be smaller compared to prior exam with associated atelectasis. Stable right lower lobe opacity is noted concerning for edema or pneumonia. Bony thorax is unremarkable. IMPRESSION: Stable support apparatus. Stable right basilar opacity. Improved left pleural effusion with associated atelectasis. Electronically Signed   By: Lupita Raider, M.D.   On: 04/09/2018 07:12   Dg Chest Port 1 View  Result Date: 04/08/2018 CLINICAL DATA:  CHF, intubated patient, malnutrition, HIV. EXAM: PORTABLE CHEST 1 VIEW COMPARISON:  Portable chest x-ray of 04/30/2018 FINDINGS: The lungs are mildly hyperinflated. The perihilar lung markings are increased and more conspicuous today. External pacemaker defibrillator pads are present. The heart is top normal in size. The pulmonary vascularity is engorged and indistinct. The endotracheal tube tip projects approximately 3 cm above the carina. The esophagogastric tube tip and proximal port project below the GE junction. The right internal jugular venous catheter tip projects over the proximal SVC. A ventriculoperitoneal shunt tube is visible to the left of midline. IMPRESSION: CHF with interstitial edema much more conspicuous than on yesterday's study. Probable posterior layering of pleural effusions. The support tubes and devices are in reasonable position. Electronically Signed   By: David  [place] M.D.   On: 04/08/2018 09:07    ASSESSMENT AND PLAN:  Acute cardiac arrest with cardiogenic shock Prolonged resuscitation  effort Continue ventilatory management, vasopressor support with Levophed/Neo-Synephrine/vasopressin with weaning as tolerated, cardiology input appreciated Echocardiogram noted for ejection fraction 25-29%/stage II diastolic dysfunction/moderate pulmonary hypertension  *Acute hypoxic respiratory failure  Secondary to acute cardiac arrest  Continue vent management   *Acute non-STEMI Secondary to acute cardiac arrest Cardiology input appreciated Continue supportive care, vasopressor support per above, echocardiogram noted above  *Acute metabolic acidosis Secondary to above Continue avoid nephrotoxic agents  *Acute renal failure Likely ATN secondary to hypotension Continue to avoid nephrotoxic agents, strict I&O monitoring, daily weights, BMP daily  *Chronic HIV infection Infectious disease input appreciated has history of HIV for a long time, history of cryptococcal meningitis in 2014,patient supposed to be on GENYOVA   *Acute encephalopathy Most likely secondary to the above cardiac arrest with respiratory failure Neurology input appreciated CT head negative for any acute process, shunt noted Started on Keppra for myoclonic jerks, EEG is pending  *Acute shock liver  Most likely secondary to acute cardiac arrest/hypotension  stable  Continue to avoid hepatotoxic agents    Condition guarded Prognosis poor Disposition pending clinical course condition guarded   All the records are reviewed and case discussed with Care Management/Social Workerr. Management plans discussed with the patient, family and they are in agreement.  CODE STATUS: DNR  TOTAL TIME TAKING CARE OF THIS PATIENT: 35 minutes.     POSSIBLE D/C IN 2-5 DAYS, DEPENDING ON CLINICAL CONDITION.   Evelena Asa Kayla Deshaies M.D on 04/09/2018   Between 7am to 6pm - Pager - [phone]  After 6pm go to [email or web address] - password [email or web address]  [facility]  Office  [phone]  CC: Primary care  physician; Patient, No Pcp Per  Note: This dictation was prepared with Dragon dictation along with smaller phrase technology. Any transcriptional errors that result from this process are unintentional.

## 2018-04-09 NOTE — Progress Notes (Addendum)
Subjective: Patient remains intubated and sedated.    Objective: Current vital signs: BP 93/68   Pulse 67   Temp 98.4 F (36.9 C)   Resp 11   Ht 5' 9"  (1.753 m)   Wt 72.3 kg (159 lb 6.3 oz)   SpO2 96%   BMI 23.54 kg/m  Vital signs in last 24 hours: Temp:  [98.2 F (36.8 C)-99.1 F (37.3 C)] 98.4 F (36.9 C) (05/03 1000) Pulse Rate:  [64-81] 67 (05/03 1000) Resp:  [9-19] 11 (05/03 1000) BP: (84-118)/(60-81) 93/68 (05/03 1000) SpO2:  [96 %-100 %] 96 % (05/03 1000) Arterial Line BP: (104-123)/(58-71) 106/62 (05/03 1000) FiO2 (%):  [30 %-100 %] 30 % (05/03 1000) Weight:  [72.3 kg (159 lb 6.3 oz)] 72.3 kg (159 lb 6.3 oz) (05/03 0500)  Intake/Output from previous day: 05/02 0701 - 05/03 0700 In: 3129.8 [I.V.:1837.6; NG/GT:119.7; IV Piggyback:1172.5] Out: 15 [Urine:15] Intake/Output this shift: Total I/O In: 855.6 [I.V.:535.6; NG/GT:320] Out: -  Nutritional status:  Diet Order    None      Neurologic Exam: Mental Status: Patient does not respond to verbal stimuli. Does not respond to deep sternal rub. Does not follow commands. No verbalizations noted.  Cranial Nerves: II: patient does not respond confrontation bilaterally,both pupils 34m with left unreactive and right reactive. III,IV,VI: doll's response absent bilaterally.  V,VII: corneal reflexabsentbilaterally VIII: patient does not respond to verbal stimuli IX,X: gag reflexreduced, XI: trapezius strength unable to test bilaterally XII: tongue strength unable to test Motor: Extremities flaccid throughout. No spontaneous movement noted. No purposeful movements noted. Sensory: Does not respond to noxious stimuli in any extremity.    Lab Results: Basic Metabolic Panel: Recent Labs  Lab 04/08/18 0031 04/08/18 0810 04/08/18 1650 04/09/18 0018 04/09/18 0459  NA 140 142 139 142 141  K 5.8* 4.9 6.0* 5.8* 5.6*  CL 98* 94* 95* 97* 98*  CO2 29 35* 30 31 29   GLUCOSE 1888 108* 115* 123* 136*  BUN 49*  57* 65* 71* 75*  CREATININE 3.74* 4.24* 4.83* 5.21* 5.24*  CALCIUM 6.3* 6.5* 6.3* 6.5* 6.4*  MG 2.1 1.9 2.0 2.1 2.0  PHOS 4.6 4.3 5.5* 6.8* 6.6*    Liver Function Tests: Recent Labs  Lab 03/09/2018 2146 04/08/18 0404 04/09/18 0459  AST 972* 2,546* 1,595*  1,602*  ALT 944* 1,860* 1,675*  1,654*  ALKPHOS 81 55 65  66  BILITOT 1.4* 1.1 1.4*  1.3*  PROT 6.2* 4.5* 4.6*  4.8*  ALBUMIN 3.7 2.6* 2.6*  2.7*   No results for input(s): LIPASE, AMYLASE in the last 168 hours. No results for input(s): AMMONIA in the last 168 hours.  CBC: Recent Labs  Lab 04/05/2018 2146 04/07/18 0353 04/08/18 0404 04/09/18 0459  WBC 15.4* 21.5* 24.1* 20.7*  NEUTROABS 13.6*  --   --  19.6*  HGB 14.5 15.3 14.0 13.1  HCT 44.1 44.9 41.0 37.8*  MCV 106.7* 106.6* 102.9* 103.8*  PLT 143* 117* 69* 52*    Cardiac Enzymes: Recent Labs  Lab 04/04/2018 2146 04/07/18 0353 04/07/18 0850 04/07/18 1545 04/08/18 0031  CKTOTAL  --   --  23,390*  --   --   TROPONINI 17.60* >65.00* >65.00* >65.00* >65.00*    Lipid Panel: Recent Labs  Lab 03/24/2018 2146  CHOL 199  TRIG 75  HDL 68  CHOLHDL 2.9  VLDL 15  LDLCALC 116*    CBG: Recent Labs  Lab 04/09/18 0202 04/09/18 0205 04/09/18 0209 04/09/18 0422 04/09/18 0933  GLUCAP 62* 94  69* 45* 113*    Microbiology: Results for orders placed or performed during the hospital encounter of 04/05/2018  MRSA PCR Screening     Status: None   Collection Time: 03/08/2018 11:45 PM  Result Value Ref Range Status   MRSA by PCR NEGATIVE NEGATIVE Final    Comment:        The GeneXpert MRSA Assay (FDA approved for NASAL specimens only), is one component of a comprehensive MRSA colonization surveillance program. It is not intended to diagnose MRSA infection nor to guide or monitor treatment for MRSA infections. Performed at [facility], [address] [place], [place] [address]   Urine Culture     Status: None   Collection Time: 03/28/2018 11:45 PM   Result Value Ref Range Status   Specimen Description   Final    URINE, RANDOM Performed at [facility], [address] [place], [place] [address]    Special Requests   Final    NONE Performed at [facility], [address] [place], [place] [address]    Culture   Final    NO GROWTH Performed at [facility], [place] [address], [place], [place] [address]    Report Status 04/08/2018 FINAL  Final  CULTURE, BLOOD (ROUTINE X 2) w Reflex to ID Panel     Status: None (Preliminary result)   Collection Time: 04/07/18  3:54 AM  Result Value Ref Range Status   Specimen Description BLOOD LEFT ANTECUBITAL  Final   Special Requests   Final    BOTTLES DRAWN AEROBIC AND ANAEROBIC Blood Culture adequate volume   Culture   Final    NO GROWTH 2 DAYS Performed at [facility], [address] [place], [place] [address]    Report Status PENDING  Incomplete  CULTURE, BLOOD (ROUTINE X 2) w Reflex to ID Panel     Status: None (Preliminary result)   Collection Time: 04/07/18  4:05 AM  Result Value Ref Range Status   Specimen Description BLOOD LEFT WRIST  Final   Special Requests   Final    BOTTLES DRAWN AEROBIC AND ANAEROBIC Blood Culture adequate volume   Culture   Final    NO GROWTH 2 DAYS Performed at [facility], [address], [place], [place] [address]    Report Status PENDING  Incomplete  Culture, respiratory (NON-Expectorated)     Status: None (Preliminary result)   Collection Time: 04/07/18  5:36 AM  Result Value Ref Range Status   Specimen Description   Final    TRACHEAL ASPIRATE Performed at [facility], [address] [place], [place] [address]    Special Requests   Final    NONE Performed at [facility], [address] [place], [place] [address]    Gram Stain   Final    FEW WBC PRESENT, PREDOMINANTLY PMN FEW GRAM POSITIVE COCCI RARE GRAM NEGATIVE RODS    Culture   Final    MODERATE STAPHYLOCOCCUS  AUREUS SUSCEPTIBILITIES TO FOLLOW Performed at [facility], [place] [address], [place],  [address]    Report Status PENDING  Incomplete    Coagulation Studies: Recent Labs    04/02/2018 02/11/2145  LABPROT 20.6*  INR 1.79    Imaging: Dg Chest Port 1 View  Result Date: 04/09/2018 CLINICAL DATA:  Pneumonia. EXAM: PORTABLE CHEST 1 VIEW COMPARISON:  Radiograph of Apr 08, 2018. FINDINGS: Stable cardiomegaly. Endotracheal and nasogastric tubes are unchanged in position. Right internal  jugular catheter is unchanged in position. Left pleural effusion appears to be smaller compared to prior exam with associated atelectasis. Stable right lower lobe opacity is noted concerning for edema or pneumonia. Bony thorax is unremarkable. IMPRESSION: Stable support apparatus. Stable right basilar opacity. Improved left pleural effusion with associated atelectasis. Electronically Signed   By: Marijo Conception, M.D.   On: 04/09/2018 07:12   Dg Chest Port 1 View  Result Date: 04/08/2018 CLINICAL DATA:  CHF, intubated patient, malnutrition, HIV. EXAM: PORTABLE CHEST 1 VIEW COMPARISON:  Portable chest x-ray of April 06, 2018 FINDINGS: The lungs are mildly hyperinflated. The perihilar lung markings are increased and more conspicuous today. External pacemaker defibrillator pads are present. The heart is top normal in size. The pulmonary vascularity is engorged and indistinct. The endotracheal tube tip projects approximately 3 cm above the carina. The esophagogastric tube tip and proximal port project below the GE junction. The right internal jugular venous catheter tip projects over the proximal SVC. A ventriculoperitoneal shunt tube is visible to the left of midline. IMPRESSION: CHF with interstitial edema much more conspicuous than on yesterday's study. Probable posterior layering of pleural effusions. The support tubes and devices are in reasonable position. Electronically Signed   By: David  Martinique M.D.   On:  04/08/2018 09:07    Medications:  I have reviewed the patient's current medications. Scheduled: . chlorhexidine gluconate (MEDLINE KIT)  15 mL Mouth Rinse BID  . docusate sodium  100 mg Oral BID  . feeding supplement (VITAL HIGH PROTEIN)  1,000 mL Per Tube Q24H  . hydrocortisone sod succinate (SOLU-CORTEF) inj  50 mg Intravenous Q8H  . insulin aspart  0-9 Units Subcutaneous Q4H  . mouth rinse  15 mL Mouth Rinse 10 times per day    Assessment/Plan: Patient remains unresponsive.  On Keppra and Versed for myoclonus.  Initial head CT unremarkable.  Concern for possible anoxic brain injury.    Recommendations: 1. Repeat head CT without contrast today    LOS: 3 days   Alexis Goodell, MD Neurology [phone] 04/09/2018  10:45 AM  Addendum: Repeat head CT shows no evidence of anoxic brain injury.  Lab work remains markedly altered with elevated LFT's and BUN/Cr.  Mental status and myoclonus may be metabolic in origin.    Alexis Goodell, MD Neurology [phone]

## 2018-04-09 NOTE — Progress Notes (Signed)
Name: Brian Boyle MRN: [id number] DOB: [birth year]     CONSULTATION DATE: 2018-04-21  Subjective & Objectives: Remains on vent, oliguric, hyperkalemia, on Versed for myoclonic seizures activity.  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  has a past surgical history that includes Ventriculoperitoneal shunt (Left, 07/2013). Prior to Admission medications   Medication Sig Start Date End Date Taking? Authorizing Provider  BIKTARVY 50-200-25 MG TABS tablet Take 1 tablet by mouth daily. 03/19/18  Yes [provider]  cetirizine (ZYRTEC) 5 MG tablet Take 10 mg by mouth daily. 03/04/18  Yes [provider]  ELIQUIS 2.5 MG TABS tablet Take 1 tablet by mouth 2 (two) times daily. 03/22/18  Yes [provider]  fluticasone (FLONASE) 50 MCG/ACT nasal spray Place 1 spray into both nostrils daily. 02/04/18  Yes [provider]  furosemide (LASIX) 40 MG tablet Take 1 tablet by mouth daily. 01/16/18  Yes [provider]  LORazepam (ATIVAN) 0.5 MG tablet Take 2 tablets by mouth daily as needed. 01/14/18  Yes [provider]  losartan (COZAAR) 25 MG tablet Take 12.5 mg by mouth daily. 03/27/18  Yes [provider]  metoprolol succinate (TOPROL-XL) 25 MG 24 hr tablet Take 1 tablet by mouth daily. 01/16/18  Yes [provider]  Multiple Vitamin (MULTIVITAMIN) capsule Take 1 capsule by mouth 4 (four) times daily.   Yes [provider]  polyethylene glycol (MIRALAX / GLYCOLAX) packet Take 17 g by mouth daily as needed.   Yes [provider]  valACYclovir (VALTREX) 1000 MG tablet Take 2 tablets ( ) by mouth at onset of symptoms and repeat after 12 hours 02/04/18  Yes [provider]   No  Known Allergies  FAMILY HISTORY:  family history is not on file. SOCIAL HISTORY:  reports that he has never smoked. He has never used smokeless tobacco. He reports that he drank alcohol.  REVIEW OF SYSTEMS:   Unable to obtain due to critical illness   VITAL SIGNS: Temp:  [98.2 F (36.8 C)-99.1 F (37.3 C)] 98.4 F (36.9 C) (05/03 1000) Pulse Rate:  [64-81] 67 (05/03 1000) Resp:  [9-19] 11 (05/03 1000) BP: (84-118)/(60-81) 93/68 (05/03 1000) SpO2:  [96 %-100 %] 96 % (05/03 1000) Arterial Line BP: (106-123)/(58-71) 106/62 (05/03 1000) FiO2 (%):  [30 %-100 %] 30 % (05/03 1000) Weight:  [72.3 kg (159 lb 6.3 oz)] 72.3 kg (159 lb 6.3 oz) (05/03 0500)  Physical Examination:  On versed to stop Mycolonic activity. weak corneal , No Doll, no gag, no DTR, triggering the vent and no spontaneous movements. Withdrew to pain by knee flexion  On vent, no distress, BEAE and no rales. S1 & S3 audible and no murmur Benign abdomen with feeble peristalses No leg edema  ASSESSMENT / PLAN:  Acute respiratory failure. Poor weaning parameters -Monitor ABG,optimize vent settings and continue with vent support.  Anoxic encephalopathy s/p cardiac arrest. Rewarming after hypothermia -On Versed + Keppra for Myclonic seizure -Repeat CT head no evidence of hypoxic injury. -Follow with neurology for the likelihood of having meaningful recovery.  Cardiogenic shock post cardiac arrest (VT vs Torsade de pointes) on Dopamin and Levophed. H/o CHF, LV mural thrombus with MI, non sustained VT -Follow with ECHO and cardiology  Metabolic acidosis (improved) with lactic acidemia and sepsis -  Off Bicarb. Gtt -Zosyn + Vanc -Monitor lactic acid and Procal.  Pneumonia and atelectasis with aspiration. Res Cult Staph Aureus.  Improved bibasilar airspace disease. -c/w Vanc + d/c Zosyn. Monitor CXR + CBC + FIO2 -Follow with ID for ABX recommendation  Oliguric AK (owrsening), hyperkalemiaI with ATN due to  sepsis and renal hypoperfusion -Temporizing agents for hyperkalemia. -Management as per renal. -Optimize hydration, hemodynamics, renal adjustment for meds,monitor renal panel and urine out put.  Elevated LFTs (improved)  with acute ischemic hepatitis s/p cardiac arrest. -Optimize hemodynamics, avoid hepatotoxins and monitor LFT.  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 -Follow with ID consult  Thrombocytopenia (worsening) -Monitor Plat  DNR  DVT & GI prophylaxis. Continue with supportive care  Patient's mother was updated at the bedside, she was made aware of declining renal function and possible need for RRT. At this point she mentioned that she won't consider  RRT till she knows how likely he may have a meaningful recovery.  Critical care time 45 min

## 2018-04-09 NOTE — Progress Notes (Signed)
Pharmacy Antibiotic Note  Brian Boyle is a 62 y.o. male admitted on 2018-05-04 s/p cardiac arrest and placed on TTM protocol.  Pharmacy has been consulted for Zosyn dosing for possible aspiration.   Plan: Zosyn EI 3.375g IV Q12hr.   Height:  (175.3 cm) Weight: 159 lb 6.3 oz (72.3 kg) IBW/kg (Calculated) : 70.7  Temp (24hrs), Avg:98.7 F (37.1 C), Min:98.2 F (36.8 C), Max:99.3 F (37.4 C)  Recent Labs  Lab 2018/05/04 2146  04/07/18 0353 04/07/18 0416 04/07/18 0850 04/07/18 1114  04/08/18 0031 04/08/18 0404 04/08/18 0810 04/08/18 0944 04/08/18 1210 04/08/18 1650 04/09/18 0018 04/09/18 0459  WBC 15.4*  --  21.5*  --   --   --   --   --  24.1*  --   --   --   --   --  20.7*  CREATININE 2.71*  --  2.63*  --  3.11*  --    < > 3.74*  --  4.24*  --   --  4.83* 5.21* 5.24*  LATICACIDVEN  --    < >  --  10.7* 10.0* 9.3*  --   --   --   --  2.9* 2.9*  --   --   --    < > = values in this interval not displayed.    Estimated Creatinine Clearance: 15.2 mL/min (A) (by C-G formula based on SCr of 5.24 mg/dL (H)).    No Known Allergies  Antimicrobials this admission: Azithromycin and CTX x 1 Zosyn 5/1 >>  Vancomycin 5/2 x 1  Dose adjustments this admission:   Microbiology results: 5/1 BCx: No growth x 2 days  4/30 UCx: No growth  5/1 TA: moderate staph  4/30 MRSA PCR: negative  Thank you for allowing pharmacy to be a part of this patient's care.  Chaim Gatley L 04/09/2018 9:06 PM

## 2018-04-09 NOTE — Progress Notes (Signed)
[name] INFECTIOUS DISEASE PROGRESS NOTE Date of Admission:  03/08/2018     ID: Brian Boyle is a 62 y.o. male with cardiac arrest, HIV Active Problems:   Cardiac arrest ([place])   Myoclonus   Malnutrition of moderate degree   Subjective: Remains critically ill, on pressors   ROS  Unable to obtain  Medications:  Antibiotics Given (last 72 hours)    Date/Time Action Medication Dose Rate   04/07/18 0259 New Bag/Given   cefTRIAXone (ROCEPHIN) 2 g in sodium chloride 0.9 % 100 mL IVPB 2 g 200 mL/hr   04/07/18 0507 New Bag/Given   azithromycin (ZITHROMAX) 500 mg in sodium chloride 0.9 % 250 mL IVPB 500 mg 250 mL/hr   04/07/18 1414 New Bag/Given   piperacillin-tazobactam (ZOSYN) IVPB 3.375 g 3.375 g 12.5 mL/hr   04/08/18 0141 New Bag/Given   piperacillin-tazobactam (ZOSYN) IVPB 3.375 g 3.375 g 12.5 mL/hr   04/08/18 1312 New Bag/Given   vancomycin (VANCOCIN) 1,500 mg in sodium chloride 0.9 % 500 mL IVPB 1,500 mg 250 mL/hr   04/08/18 1537 New Bag/Given   piperacillin-tazobactam (ZOSYN) IVPB 3.375 g 3.375 g 12.5 mL/hr   04/09/18 0211 New Bag/Given   piperacillin-tazobactam (ZOSYN) IVPB 3.375 g 3.375 g 12.5 mL/hr     . chlorhexidine gluconate (MEDLINE KIT)  15 mL Mouth Rinse BID  . docusate sodium  100 mg Oral BID  . hydrocortisone sod succinate (SOLU-CORTEF) inj  50 mg Intravenous Q12H  . insulin aspart  0-9 Units Subcutaneous Q4H  . mouth rinse  15 mL Mouth Rinse 10 times per day    Objective: Vital signs in last 24 hours: Temp:  [98.2 F (36.8 C)-99.3 F (37.4 C)] 98.8 F (37.1 C) (05/03 1400) Pulse Rate:  [64-81] 66 (05/03 1400) Resp:  [9-18] 12 (05/03 1400) BP: (84-118)/(60-81) 90/64 (05/03 1400) SpO2:  [93 %-100 %] 95 % (05/03 1400) Arterial Line BP: (97-123)/(56-71) 102/58 (05/03 1400) FiO2 (%):  [30 %-100 %] 30 % (05/03 1400) Weight:  [72.3 kg (159 lb 6.3 oz)] 72.3 kg (159 lb 6.3 oz) (05/03 0500) Constitutional: intubaed, sedated, critically ill Mouth/Throat:  ett in place Cardiovascular: tachy, reg Pulmonary/Chest: mech BS Abdominal: Soft. Bowel sounds are normal. He exhibits no distension. There is no tenderness.  Lymphadenopathy: He has no cervical adenopathy.  Neurological: intubaed and sedated Skin:  Cyanosis Psychiatric: unable to obtain Ext cyanotic extremities    Lab Results Recent Labs    04/08/18 0404  04/09/18 0018 04/09/18 0459  WBC 24.1*  --   --  20.7*  HGB 14.0  --   --  13.1  HCT 41.0  --   --  37.8*  NA  --    < > 142 141  K  --    < > 5.8* 5.6*  CL  --    < > 97* 98*  CO2  --    < > 31 29  BUN  --    < > 71* 75*  CREATININE  --    < > 5.21* 5.24*   < > = values in this interval not displayed.    Microbiology: @micro @ Studies/Results: Ct Head Wo Contrast  Result Date: 04/09/2018 CLINICAL DATA:  Cardiac arrest 3 days ago.  HIV EXAM: CT HEAD WITHOUT CONTRAST TECHNIQUE: Contiguous axial images were obtained from the base of the skull through the vertex without intravenous contrast. COMPARISON:  CT head 03/17/2018 FINDINGS: Brain: Left frontal ventricular shunt catheter unchanged in position in the midline posterior to  the lateral ventricles. Slit-like left lateral ventricle unchanged. Right lateral ventricle not enlarged and unchanged. Hypodensity left frontal lobe at the site of catheter insertion is unchanged compatible with chronic encephalomalacia. No acute infarct, hemorrhage, or mass. Vascular: Negative for hyperdense vessel Skull: Negative Sinuses/Orbits: Mild mucosal edema paranasal sinuses.  Normal orbit Other: None IMPRESSION: Left frontal shunt catheter unchanged.  Negative for hydrocephalus. Negative for acute infarct or hemorrhage. No evidence of hypoxic injury. Electronically Signed   By: Franchot Gallo M.D.   On: 04/09/2018 11:41   Dg Chest Port 1 View  Result Date: 04/09/2018 CLINICAL DATA:  Pneumonia. EXAM: PORTABLE CHEST 1 VIEW COMPARISON:  Radiograph of Apr 08, 2018. FINDINGS: Stable cardiomegaly.  Endotracheal and nasogastric tubes are unchanged in position. Right internal jugular catheter is unchanged in position. Left pleural effusion appears to be smaller compared to prior exam with associated atelectasis. Stable right lower lobe opacity is noted concerning for edema or pneumonia. Bony thorax is unremarkable. IMPRESSION: Stable support apparatus. Stable right basilar opacity. Improved left pleural effusion with associated atelectasis. Electronically Signed   By: Marijo Conception, M.D.   On: 04/09/2018 07:12   Dg Chest Port 1 View  Result Date: 04/08/2018 CLINICAL DATA:  CHF, intubated patient, malnutrition, HIV. EXAM: PORTABLE CHEST 1 VIEW COMPARISON:  Portable chest x-ray of April 06, 2018 FINDINGS: The lungs are mildly hyperinflated. The perihilar lung markings are increased and more conspicuous today. External pacemaker defibrillator pads are present. The heart is top normal in size. The pulmonary vascularity is engorged and indistinct. The endotracheal tube tip projects approximately 3 cm above the carina. The esophagogastric tube tip and proximal port project below the GE junction. The right internal jugular venous catheter tip projects over the proximal SVC. A ventriculoperitoneal shunt tube is visible to the left of midline. IMPRESSION: CHF with interstitial edema much more conspicuous than on yesterday's study. Probable posterior layering of pleural effusions. The support tubes and devices are in reasonable position. Electronically Signed   By: Cherri Yera  Martinique M.D.   On: 04/08/2018 09:07    Assessment/Plan: [name] is a 62 y.o. male well controlled HIV, last CD and VL at [place] and < 20 in Nov 2018 on [place] who had out of hospital cardiac arrest and was admitted to the ICU after ROSC after 40 minutes of down time.  He had a history of cardiomyopathy, prior NSVT but was apparently stable prior to event.  His HIV was well controlled and unlikely that his HIV or medications have much  impact on his current dire situation. 5/3- cxr with improvement but mild R base opacity, trach cx with staph, MRSA PCR neg  Recommendations Hold ARVs- (Biktarvy) during critical illness. If he improves and CrCl > 30 can restart.   Cont zosyn for possible aspiration x 7 days. MRSA PCR negative.    Thank you very much for the consult. Will follow with you.  Leonel Ramsay   04/09/2018, 2:56 PM

## 2018-04-09 NOTE — Progress Notes (Signed)
Decreased fio2 to 30% 

## 2018-04-09 NOTE — Progress Notes (Signed)
SUBJECTIVE: Remains intubated and sedated.    Vitals:   04/09/18 0346 04/09/18 0400 04/09/18 0500 04/09/18 0600  BP:  118/81 112/73 (!) 84/69  Pulse:  71 69 67  Resp:  Temp:  98.6 F (37 C) 98.6 F (37 C) 98.2 F (36.8 C)  TempSrc:  Bladder Bladder Bladder  SpO2: 100% 100% 100% 99%  Weight:   159 lb 6.3 oz (72.3 kg)   Height:        Intake/Output Summary (Last 24 hours) at 04/09/2018 0903 Last data filed at 04/09/2018 0600 Gross per 24 hour  Intake 2973.59 ml  Output 12 ml  Net 2961.59 ml    LABS: Basic Metabolic Panel: Recent Labs    04/09/18 0018 04/09/18 0459  NA 142 141  K 5.8* 5.6*  CL 97* 98*  CO2 31 29  GLUCOSE 123* 136*  BUN 71* 75*  CREATININE 5.21* 5.24*  CALCIUM 6.5* 6.4*  MG 2.1 2.0  PHOS 6.8* 6.6*   Liver Function Tests: Recent Labs    04/08/18 0404 04/09/18 0459  AST 2,546* 1,595*  1,602*  ALT 1,860* 1,675*  1,654*  ALKPHOS 55 65  66  BILITOT 1.1 1.4*  1.3*  PROT 4.5* 4.6*  4.8*  ALBUMIN 2.6* 2.6*  2.7*   No results for input(s): LIPASE, AMYLASE in the last 72 hours. CBC: Recent Labs    Apr 21, 2018 2146  04/08/18 0404 04/09/18 0459  WBC 15.4*   < > 24.1* 20.7*  NEUTROABS 13.6*  --   --  19.6*  HGB 14.5   < > 14.0 13.1  HCT 44.1   < > 41.0 37.8*  MCV 106.7*   < > 102.9* 103.8*  PLT 143*   < > 69* 52*   < > = values in this interval not displayed.   Cardiac Enzymes: Recent Labs    04/07/18 0850 04/07/18 1545 04/08/18 0031  CKTOTAL 23,390*  --   --   TROPONINI >65.00* >65.00* >65.00*   BNP: Invalid input(s): POCBNP D-Dimer: No results for input(s): DDIMER in the last 72 hours. Hemoglobin A1C: No results for input(s): HGBA1C in the last 72 hours. Fasting Lipid Panel: Recent Labs    2018-04-21 2146  CHOL 199  HDL 68  LDLCALC 116*  TRIG 75  CHOLHDL 2.9   Thyroid Function Tests: No results for input(s): TSH, T4TOTAL, T3FREE, THYROIDAB in the last 72 hours.  Invalid input(s): FREET3 Anemia Panel: No  results for input(s): VITAMINB12, FOLATE, FERRITIN, TIBC, IRON, RETICCTPCT in the last 72 hours.   PHYSICAL EXAM General: Intubated and sedated HEENT:  Normocephalic and atramatic Neck:  No JVD.  Lungs: Clear bilaterally to auscultation and percussion. Heart: HRRR . Normal S1 and S2 without gallops or murmurs.  Msk:  Contraction of hands. Extremities: No clubbing, cyanosis or edema.   Neuro: intubated and sedated, unresponseive Psych:  Intubated and sedated  TELEMETRY: NSR 68bpm  ASSESSMENT AND PLAN: S/P NSTEMI with cardiac arrest and prolonged resuscitation. Remains intubated and sedated on pressors. Echo shows severe LV dysfunction EF 25% with 4 chamber dilatation and grade 2 diastolic dysfunction. Neurologic status is unclear at this time. Continue cardiopulmonary support, no changes to cardiovascular medications at this time.   Active Problems:   Cardiac arrest (HCC)   Myoclonus   Malnutrition of moderate degree    Caroleen Hamman, NP-C 04/09/2018 9:03 AM Cell: [phone]

## 2018-04-09 NOTE — Care Management (Signed)
RNCM consult for LTAC screening.  I have requested both [facility] and [facility] to review case. Patient remains vented and sedated.  I spoke with patient's next of kin- Mother Jerrol Banana [phone]. She states she is 62 years old and cannot drive. She said I he is moved out of [place] she would never get to see him.  She declines transfer to [facility]. I have notified [facility] to cancel screen.

## 2018-04-10 ENCOUNTER — Inpatient Hospital Stay: Payer: Medicare HMO

## 2018-04-10 LAB — COMPREHENSIVE METABOLIC PANEL
ALBUMIN: 2.6 g/dL — AB (ref 3.5–5.0)
ALK PHOS: 131 U/L — AB (ref 38–126)
ALT: 1777 U/L — ABNORMAL HIGH (ref 17–63)
ANION GAP: 18 — AB (ref 5–15)
AST: 1150 U/L — ABNORMAL HIGH (ref 15–41)
BILIRUBIN TOTAL: 1.8 mg/dL — AB (ref 0.3–1.2)
BUN: 99 mg/dL — ABNORMAL HIGH (ref 6–20)
CALCIUM: 6.3 mg/dL — AB (ref 8.9–10.3)
CO2: 25 mmol/L (ref 22–32)
Chloride: 98 mmol/L — ABNORMAL LOW (ref 101–111)
Creatinine, Ser: 6.48 mg/dL — ABNORMAL HIGH (ref 0.61–1.24)
GFR calc non Af Amer: 8 mL/min — ABNORMAL LOW (ref >60)
GFR, EST AFRICAN AMERICAN: 10 mL/min — AB (ref >60)
Glucose, Bld: 132 mg/dL — ABNORMAL HIGH (ref 65–99)
Potassium: 5.8 mmol/L — ABNORMAL HIGH (ref 3.5–5.1)
SODIUM: 141 mmol/L (ref 135–145)
TOTAL PROTEIN: 4.9 g/dL — AB (ref 6.5–8.1)

## 2018-04-10 LAB — AMMONIA: Ammonia: 25 umol/L (ref 9–35)

## 2018-04-10 LAB — GLUCOSE, CAPILLARY
GLUCOSE-CAPILLARY: 107 mg/dL — AB (ref 65–99)
GLUCOSE-CAPILLARY: 124 mg/dL — AB (ref 65–99)
GLUCOSE-CAPILLARY: 80 mg/dL (ref 65–99)
Glucose-Capillary: 99 mg/dL (ref 65–99)
Glucose-Capillary: 103 mg/dL — ABNORMAL HIGH (ref 65–99)
Glucose-Capillary: 92 mg/dL (ref 65–99)
Glucose-Capillary: 113 mg/dL — ABNORMAL HIGH (ref 65–99)
Glucose-Capillary: 148 mg/dL — ABNORMAL HIGH (ref 65–99)

## 2018-04-10 LAB — CBC WITH DIFFERENTIAL/PLATELET
BASOS PCT: 0 %
Basophils Absolute: 0.1 10*3/uL (ref 0–0.1)
EOS ABS: 0 10*3/uL (ref 0–0.7)
Eosinophils Relative: 0 %
HCT: 36.6 % — ABNORMAL LOW (ref 40.0–52.0)
HEMOGLOBIN: 12.6 g/dL — AB (ref 13.0–18.0)
Lymphocytes Relative: 2 %
Lymphs Abs: 0.4 10*3/uL — ABNORMAL LOW (ref 1.0–3.6)
MCH: 35.6 pg — ABNORMAL HIGH (ref 26.0–34.0)
MCHC: 34.4 g/dL (ref 32.0–36.0)
MCV: 103.7 fL — ABNORMAL HIGH (ref 80.0–100.0)
Monocytes Absolute: 0.6 10*3/uL (ref 0.2–1.0)
Monocytes Relative: 4 %
NEUTROS PCT: 94 %
Neutro Abs: 14.5 10*3/uL — ABNORMAL HIGH (ref 1.4–6.5)
Platelets: 39 10*3/uL — ABNORMAL LOW (ref 150–440)
RBC: 3.54 MIL/uL — ABNORMAL LOW (ref 4.40–5.90)
RDW: 13.5 % (ref 11.5–14.5)
WBC: 15.5 10*3/uL — ABNORMAL HIGH (ref 3.8–10.6)

## 2018-04-10 LAB — LACTIC ACID, PLASMA
LACTIC ACID, VENOUS: 1.3 mmol/L (ref 0.5–1.9)
Lactic Acid, Venous: 1.6 mmol/L (ref 0.5–1.9)

## 2018-04-10 LAB — CULTURE, RESPIRATORY W GRAM STAIN

## 2018-04-10 LAB — POTASSIUM
Potassium: 5.1 mmol/L (ref 3.5–5.1)
Potassium: 5.2 mmol/L — ABNORMAL HIGH (ref 3.5–5.1)

## 2018-04-10 LAB — PHOSPHORUS: Phosphorus: 6.1 mg/dL — ABNORMAL HIGH (ref 2.5–4.6)

## 2018-04-10 LAB — CALCIUM, IONIZED: CALCIUM, IONIZED, SERUM: 3.6 mg/dL — AB (ref 4.5–5.6)

## 2018-04-10 LAB — CULTURE, RESPIRATORY

## 2018-04-10 LAB — MAGNESIUM: MAGNESIUM: 2.1 mg/dL (ref 1.7–2.4)

## 2018-04-10 MED ORDER — MIDAZOLAM BOLUS VIA INFUSION
2.0000 mg | Freq: Once | INTRAVENOUS | Status: AC
  Administered 2018-04-10: 2 mg via INTRAVENOUS
  Filled 2018-04-10: qty 2

## 2018-04-10 MED ORDER — PIPERACILLIN-TAZOBACTAM 3.375 G IVPB
3.3750 g | Freq: Two times a day (BID) | INTRAVENOUS | Status: DC
  Administered 2018-04-10 – 2018-04-12 (×5): 3.375 g via INTRAVENOUS
  Filled 2018-04-10 (×5): qty 50

## 2018-04-10 MED ORDER — SODIUM CHLORIDE 0.9 % IV SOLN
2.0000 g | Freq: Once | INTRAVENOUS | Status: AC
  Administered 2018-04-10: 2 g via INTRAVENOUS
  Filled 2018-04-10: qty 20

## 2018-04-10 NOTE — Progress Notes (Signed)
Pharmacy Antibiotic Note  Brian Boyle is a 62 y.o. male admitted on 04/20/2018 s/p cardiac arrest and placed on TTM protocol.  Pharmacy has been consulted for Zosyn dosing for possible aspiration.   Plan: Zosyn EI 3.375g IV Q12hr. Per ID will do a total of 7 days of therapy.   Height:  (175.3 cm) Weight: 168 lb 14 oz (76.6 kg) IBW/kg (Calculated) : 70.7  Temp (24hrs), Avg:99.3 F (37.4 C), Min:98.2 F (36.8 C), Max:100.4 F (38 C)  Recent Labs  Lab 04/25/2018 2146  04/07/18 0353 04/07/18 0416 04/07/18 0850 04/07/18 1114  04/08/18 0404 04/08/18 0810 04/08/18 0944 04/08/18 1210 04/08/18 1650 04/09/18 0018 04/09/18 0459 04/10/18 0304  WBC 15.4*  --  21.5*  --   --   --   --  24.1*  --   --   --   --   --  20.7* 15.5*  CREATININE 2.71*  --  2.63*  --  3.11*  --    < >  --  4.24*  --   --  4.83* 5.21* 5.24* 6.48*  LATICACIDVEN  --    < >  --  10.7* 10.0* 9.3*  --   --   --  2.9* 2.9*  --   --   --   --    < > = values in this interval not displayed.    Estimated Creatinine Clearance: 12.3 mL/min (A) (by C-G formula based on SCr of 6.48 mg/dL (H)).    No Known Allergies  Antimicrobials this admission: Azithromycin and CTX x 1 Zosyn 5/1 >>  Vancomycin 5/2 x 1  Dose adjustments this admission:   Microbiology results: 5/1 BCx: No growth x 2 days  4/30 UCx: No growth  5/1 TA: moderate staph  4/30 MRSA PCR: negative  Thank you for allowing pharmacy to be a part of this patient's care.  Yolanda Bonine, PharmD Pharmacy Resident 04/10/2018 2:33 PM

## 2018-04-10 NOTE — Progress Notes (Signed)
[facility] [place] at [facility]   PATIENT NAME: Brian Boyle    MR#:  [id number]  DATE OF BIRTH:  [birth year]  SUBJECTIVE:  Patient seen and evaluated today He is on ventilator for respiratory support Patient continues to be comatose, case discussed with intensivist-patient remains critically ill with poor prognosis, neurology/cardiology input appreciated  REVIEW OF SYSTEMS:  Could not be obtained as patient is comatose and on ventilator ROS  DRUG ALLERGIES:  No Known Allergies  VITALS:  Blood pressure 95/70, pulse 75, temperature (!) 100.4 F (38 C), resp. rate 17, height  (1.753 m), weight 76.6 kg (168 lb 14 oz), SpO2 100 %.  PHYSICAL EXAMINATION:  GENERAL:  62 y.o.-year-old patient lying in the bed on ventilator Ventilator settings Tidal volume 500 Rate 20 PEEP 5 FiO2 35% EYES: Pupils equal, round, reactive to light and accommodation. No scleral icterus. Extraocular muscles intact.  HEENT: Head atraumatic, normocephalic. Oropharynx has endotracheal tube NECK:  Supple, no jugular venous distention. No thyroid enlargement, no tenderness.  LUNGS: Decreased breath sounds bilaterally, scattered rales in both lungs. No use of accessory muscles of respiration.  CARDIOVASCULAR: S1, S2 normal. No murmurs, rubs, or gallops.  ABDOMEN: Soft, nontender, nondistended. Bowel sounds present. No organomegaly or mass.  EXTREMITIES: No pedal edema, cyanosis, or clubbing.  NEUROLOGIC: Comatose Decreased responsiveness Complete exam not possible PSYCHIATRIC: could not be assessed SKIN: No obvious rash, lesion, or ulcer.   Physical Exam LABORATORY PANEL:   CBC Recent Labs  Lab 04/10/18 0304  WBC 15.5*  HGB 12.6*  HCT 36.6*  PLT 39*   ------------------------------------------------------------------------------------------------------------------  Chemistries  Recent Labs  Lab 04/10/18 0304  NA 141  K 5.8*  CL 98*  CO2 25  GLUCOSE 132*  BUN 99*   CREATININE 6.48*  CALCIUM 6.3*  MG 2.1  AST 1,150*  ALT 1,777*  ALKPHOS 131*  BILITOT 1.8*   ------------------------------------------------------------------------------------------------------------------  Cardiac Enzymes Recent Labs  Lab 04/07/18 1545 04/08/18 0031  TROPONINI >65.00* >65.00*   ------------------------------------------------------------------------------------------------------------------  RADIOLOGY:  Ct Head Wo Contrast  Result Date: 04/09/2018 CLINICAL DATA:  Cardiac arrest 3 days ago.  HIV EXAM: CT HEAD WITHOUT CONTRAST TECHNIQUE: Contiguous axial images were obtained from the base of the skull through the vertex without intravenous contrast. COMPARISON:  CT head 03/14/2018 FINDINGS: Brain: Left frontal ventricular shunt catheter unchanged in position in the midline posterior to the lateral ventricles. Slit-like left lateral ventricle unchanged. Right lateral ventricle not enlarged and unchanged. Hypodensity left frontal lobe at the site of catheter insertion is unchanged compatible with chronic encephalomalacia. No acute infarct, hemorrhage, or mass. Vascular: Negative for hyperdense vessel Skull: Negative Sinuses/Orbits: Mild mucosal edema paranasal sinuses.  Normal orbit Other: None IMPRESSION: Left frontal shunt catheter unchanged.  Negative for hydrocephalus. Negative for acute infarct or hemorrhage. No evidence of hypoxic injury. Electronically Signed   By: Marlan Palau M.D.   On: 04/09/2018 11:41   Dg Chest Port 1 View  Result Date: 04/10/2018 CLINICAL DATA:  Pneumonia. EXAM: PORTABLE CHEST 1 VIEW COMPARISON:  Chest x-ray from yesterday. FINDINGS: Unchanged positioning of the endotracheal tube, enteric tube, and right internal jugular central venous catheter. 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 Stable cardiomediastinal silhouette. Hazy opacity in the right mid to lower lung appears slightly worse when compared to prior  study. Unchanged small left pleural effusion and left basilar atelectasis. No pneumothorax. No acute osseous abnormality. IMPRESSION: 1. Slightly worsened hazy opacity in the right mid to  lower lung which may reflect a combination of layering pleural fluid and edema/infiltrate. Electronically Signed   By: Obie Dredge M.D.   On: 04/10/2018 07:13   Dg Chest Port 1 View  Result Date: 04/09/2018 CLINICAL DATA:  Pneumonia. EXAM: PORTABLE CHEST 1 VIEW COMPARISON:  Radiograph of Apr 08, 2018. FINDINGS: Stable cardiomegaly. Endotracheal and nasogastric tubes are unchanged in position. Right internal jugular catheter is unchanged in position. Left pleural effusion appears to be smaller compared to prior exam with associated atelectasis. Stable right lower lobe opacity is noted concerning for edema or pneumonia. Bony thorax is unremarkable. IMPRESSION: Stable support apparatus. Stable right basilar opacity. Improved left pleural effusion with associated atelectasis. Electronically Signed   By: Lupita Raider, M.D.   On: 04/09/2018 07:12    ASSESSMENT AND PLAN:  Acute cardiac arrest with cardiogenic shock Prolonged resuscitation effort Continue ventilatory management, vasopressor support with Levophed/Neo-Synephrine/vasopressin with weaning as tolerated, cardiology input appreciated Echocardiogram noted for ejection fraction 25-29%/stage II diastolic dysfunction/moderate pulmonary hypertension  *Acute hypoxic respiratory failure  Secondary to acute cardiac arrest  Continue vent management   *Acute non-STEMI Secondary to acute cardiac arrest Cardiology input appreciated Continue supportive care, vasopressor support per above, echocardiogram noted above  *Acute metabolic acidosis Secondary to above Continue avoid nephrotoxic agents  *Acute renal failure Likely ATN secondary to hypotension Continue to avoid nephrotoxic agents, strict I&O monitoring, daily weights, BMP daily Decision on CRRT to be  made by the family Nephrology follow-up appreciated  *Chronic HIV infection Infectious disease input appreciated has history of HIV for a long time, history of cryptococcal meningitis in 2014,patient supposed to be on GENYOVA   *Acute encephalopathy Most likely secondary to the above cardiac arrest with respiratory failure Neurology input appreciated CT head negative for any acute process, shunt noted Started on Keppra for myoclonic jerks, EEG is pending  *Acute shock liver  Most likely secondary to acute cardiac arrest/hypotension  stable  Continue to avoid hepatotoxic agents Follow-up liver function tests  Hyperkalemia   Condition guarded Long-term prognosis poor Disposition pending clinical course condition guarded   All the records are reviewed and case discussed with Care Management/Social Workerr. Management plans discussed with the patient, family and they are in agreement.  CODE STATUS: DNR  TOTAL TIME TAKING CARE OF THIS PATIENT: 36 minutes.     POSSIBLE D/C IN 2-5 DAYS, DEPENDING ON CLINICAL CONDITION.   Ihor Austin M.D on 04/10/2018   Between 7am to 6pm - Pager - [phone]  After 6pm go to [email or web address] - password [email or web address]  [facility]  Office  [phone]  CC: Primary care physician; Patient, No Pcp Per  Note: This dictation was prepared with Dragon dictation along with smaller phrase technology. Any transcriptional errors that result from this process are unintentional.

## 2018-04-10 NOTE — Progress Notes (Signed)
Rt assisted with patient transport to MRI from ICU while patient was on the trilogy ventilator. No complications noted.

## 2018-04-10 NOTE — Progress Notes (Signed)
Subjective: Patient remains intubated and sedated.    Objective: Current vital signs: BP 95/70   Pulse 75   Temp (!) 100.4 F (38 C)   Resp 17   Ht _0  (1.753 m)   Wt 168 lb 14 oz (76.6 kg)   SpO2 100%   BMI 24.94 kg/m  Vital signs in last 24 hours: Temp:  [98.2 F (36.8 C)-100.4 F (38 C)] 100.4 F (38 C) (05/04 1100) Pulse Rate:  [66-82] 75 (05/04 1100) Resp:  [12-23] 17 (05/04 1100) BP: (82-113)/(57-82) 95/70 (05/04 1100) SpO2:  [90 %-100 %] 100 % (05/04 1104) Arterial Line BP: (97-225)/(56-79) 108/68 (05/04 1100) FiO2 (%):  [30 %-40 %] 35 % (05/04 1104) Weight:  [168 lb 14 oz (76.6 kg)] 168 lb 14 oz (76.6 kg) (05/04 0406)  Intake/Output from previous day: 05/03 0701 - 05/04 0700 In: 3626.3 [I.V.:2283.8; NG/GT:1077.5; IV Piggyback:265] Out: 10 [Urine:10] Intake/Output this shift: Total I/O In: 1067.6 [I.V.:627.6; NG/GT:440] Out: -  Nutritional status:  Diet Order    None      Neurologic Exam: Mental Status: Patient does not respond to verbal stimuli. Does not respond to deep sternal rub. Does not follow commands. No verbalizations noted.  Cranial Nerves: II: patient does not respond confrontation bilaterally,both pupils 65m with left unreactive and right reactive. III,IV,VI: doll's response absent bilaterally.  V,VII: corneal reflexabsentbilaterally VIII: patient does not respond to verbal stimuli IX,X: gag reflexreduced, XI: trapezius strength unable to test bilaterally XII: tongue strength unable to test Motor: Extremities flaccid throughout. No spontaneous movement noted. No purposeful movements noted. Sensory: Does not respond to noxious stimuli in any extremity.    Lab Results: Basic Metabolic Panel: Recent Labs  Lab 04/08/18 0810 04/08/18 1650 04/09/18 0018 04/09/18 0459 04/09/18 1430 04/09/18 2021 04/10/18 0304  NA 142 139 142 141  --   --  141  K 4.9 6.0* 5.8* 5.6* 5.8* 5.7* 5.8*  CL 94* 95* 97* 98*  --   --  98*  CO2 35*  _1 --   --  25  GLUCOSE 108* 115* 123* 136*  --   --  132*  BUN 57* 65* 71* 75*  --   --  99*  CREATININE 4.24* 4.83* 5.21* 5.24*  --   --  6.48*  CALCIUM 6.5* 6.3* 6.5* 6.4*  --   --  6.3*  MG 1.9 2.0 2.1 2.0  --   --  2.1  PHOS 4.3 5.5* 6.8* 6.6*  --   --  6.1*    Liver Function Tests: Recent Labs  Lab 03/19/2018 2146 04/08/18 0404 04/09/18 0459 04/10/18 0304  AST 972* 2,546* 1,595*  1,602* 1,150*  ALT 944* 1,860* 1,675*  1,654* 1,777*  ALKPHOS 81 55 65  66 131*  BILITOT 1.4* 1.1 1.4*  1.3* 1.8*  PROT 6.2* 4.5* 4.6*  4.8* 4.9*  ALBUMIN 3.7 2.6* 2.6*  2.7* 2.6*   No results for input(s): LIPASE, AMYLASE in the last 168 hours. Recent Labs  Lab 04/10/18 0304  AMMONIA 25    CBC: Recent Labs  Lab 04/03/2018 2146 04/07/18 0353 04/08/18 0404 04/09/18 0459 04/10/18 0304  WBC 15.4* 21.5* 24.1* 20.7* 15.5*  NEUTROABS 13.6*  --   --  19.6* 14.5*  HGB 14.5 15.3 14.0 13.1 12.6*  HCT 44.1 44.9 41.0 37.8* 36.6*  MCV 106.7* 106.6* 102.9* 103.8* 103.7*  PLT 143* 117* 69* 52* 39*    Cardiac Enzymes: Recent Labs  Lab 03/08/2018 2146 04/07/18 0353 04/07/18  0174 04/07/18 1545 04/08/18 0031  CKTOTAL  --   --  23,390*  --   --   TROPONINI 17.60* >65.00* >65.00* >65.00* >65.00*    Lipid Panel: Recent Labs  Lab 04/04/2018 2146  CHOL 199  TRIG 75  HDL 68  CHOLHDL 2.9  VLDL 15  LDLCALC 116*    CBG: Recent Labs  Lab 04/09/18 1937 04/09/18 2312 04/10/18 0402 04/10/18 0724 04/10/18 1146  GLUCAP 91 88 107* 124* 61    Microbiology: Results for orders placed or performed during the hospital encounter of 03/21/2018  MRSA PCR Screening     Status: None   Collection Time: 03/26/2018 11:45 PM  Result Value Ref Range Status   MRSA by PCR NEGATIVE NEGATIVE Final    Comment:        The GeneXpert MRSA Assay (FDA approved for NASAL specimens only), is one component of a comprehensive MRSA colonization surveillance program. It is not intended to diagnose  MRSA infection nor to guide or monitor treatment for MRSA infections. Performed at [facility], [address], [place], [place] [address]   Urine Culture     Status: None   Collection Time: 04/05/2018 11:45 PM  Result Value Ref Range Status   Specimen Description   Final    URINE, RANDOM Performed at [facility], [address], [place], [place] [address]    Special Requests   Final    NONE Performed at [facility], [address], [place], [place] [address]    Culture   Final    NO GROWTH Performed at [facility], [place] [address], [place], [place] [address]    Report Status 04/08/2018 FINAL  Final  CULTURE, BLOOD (ROUTINE X 2) w Reflex to ID Panel     Status: None (Preliminary result)   Collection Time: 04/07/18  3:54 AM  Result Value Ref Range Status   Specimen Description BLOOD LEFT ANTECUBITAL  Final   Special Requests   Final    BOTTLES DRAWN AEROBIC AND ANAEROBIC Blood Culture adequate volume   Culture   Final    NO GROWTH 3 DAYS Performed at [facility], [address] [place], [place] [address]    Report Status PENDING  Incomplete  CULTURE, BLOOD (ROUTINE X 2) w Reflex to ID Panel     Status: None (Preliminary result)   Collection Time: 04/07/18  4:05 AM  Result Value Ref Range Status   Specimen Description BLOOD LEFT WRIST  Final   Special Requests   Final    BOTTLES DRAWN AEROBIC AND ANAEROBIC Blood Culture adequate volume   Culture   Final    NO GROWTH 3 DAYS Performed at [facility], [address], [place], [place] [address]    Report Status PENDING  Incomplete  Culture, respiratory (NON-Expectorated)     Status: None   Collection Time: 04/07/18  5:36 AM  Result Value Ref Range Status   Specimen Description   Final    TRACHEAL ASPIRATE Performed at [facility], [address], [place], [place] [address]    Special Requests   Final    NONE Performed at [facility], [address] [place], [place] [address]    Gram Stain   Final    FEW WBC PRESENT, PREDOMINANTLY PMN FEW GRAM POSITIVE COCCI RARE GRAM NEGATIVE RODS    Culture   Final    MODERATE STAPHYLOCOCCUS AUREUS WITHIN NORMAL RESPIRATORY FLORA Performed at [facility], [place] [address], [place], [place] [address]    Report Status 04/10/2018 FINAL  Final   Organism ID, Bacteria STAPHYLOCOCCUS AUREUS  Final      Susceptibility   Staphylococcus aureus - MIC*    CIPROFLOXACIN >=8 RESISTANT Resistant     ERYTHROMYCIN <=0.25 SENSITIVE Sensitive     GENTAMICIN <=0.5 SENSITIVE Sensitive     OXACILLIN <=0.25 SENSITIVE Sensitive     TETRACYCLINE <=1 SENSITIVE Sensitive     VANCOMYCIN <=0.5 SENSITIVE Sensitive     TRIMETH/SULFA <=10 SENSITIVE Sensitive     CLINDAMYCIN <=0.25 SENSITIVE Sensitive     RIFAMPIN <=0.5 SENSITIVE Sensitive     Inducible Clindamycin NEGATIVE Sensitive     * MODERATE STAPHYLOCOCCUS AUREUS    Coagulation Studies: No results for input(s): LABPROT, INR in the last 72 hours.  Imaging: Ct Head Wo Contrast  Result Date: 04/09/2018 CLINICAL DATA:  Cardiac arrest 3 days ago.  HIV EXAM: CT HEAD WITHOUT CONTRAST TECHNIQUE: Contiguous axial images were obtained from the base of the skull through the vertex without intravenous contrast. COMPARISON:  CT head 03/22/2018 FINDINGS: Brain: Left frontal ventricular shunt catheter unchanged in position in the midline posterior to the lateral ventricles. Slit-like left lateral ventricle unchanged. Right lateral ventricle not enlarged and unchanged. Hypodensity left frontal lobe at the site of catheter insertion is unchanged compatible with chronic encephalomalacia. No acute infarct, hemorrhage, or mass. Vascular: Negative for hyperdense vessel Skull: Negative Sinuses/Orbits: Mild mucosal edema paranasal sinuses.  Normal orbit Other: None IMPRESSION: Left frontal shunt catheter unchanged.  Negative for hydrocephalus. Negative for acute infarct or  hemorrhage. No evidence of hypoxic injury. Electronically Signed   By: Franchot Gallo M.D.   On: 04/09/2018 11:41   Dg Chest Port 1 View  Result Date: 04/10/2018 CLINICAL DATA:  Pneumonia. EXAM: PORTABLE CHEST 1 VIEW COMPARISON:  Chest x-ray from yesterday. FINDINGS: Unchanged positioning of the endotracheal tube, enteric tube, and right internal jugular central venous catheter. 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 Stable cardiomediastinal silhouette. Hazy opacity in the right mid to lower lung appears slightly worse when compared to prior study. Unchanged small left pleural effusion and left basilar atelectasis. No pneumothorax. No acute osseous abnormality. IMPRESSION: 1. Slightly worsened hazy opacity in the right mid to lower lung which may reflect a combination of layering pleural fluid and edema/infiltrate. Electronically Signed   By: Titus Dubin M.D.   On: 04/10/2018 07:13   Dg Chest Port 1 View  Result Date: 04/09/2018 CLINICAL DATA:  Pneumonia. EXAM: PORTABLE CHEST 1 VIEW COMPARISON:  Radiograph of Apr 08, 2018. FINDINGS: Stable cardiomegaly. Endotracheal and nasogastric tubes are unchanged in position. Right internal jugular catheter is unchanged in position. Left pleural effusion appears to be smaller compared to prior exam with associated atelectasis. Stable right lower lobe opacity is noted concerning for edema or pneumonia. Bony thorax is unremarkable. IMPRESSION: Stable support apparatus. Stable right basilar opacity. Improved left pleural effusion with associated atelectasis. Electronically Signed   By: Marijo Conception, M.D.   On: 04/09/2018 07:12    Medications:  I have reviewed the patient's current medications. Scheduled: . chlorhexidine gluconate (MEDLINE KIT)  15 mL Mouth Rinse BID  . docusate  100 mg Per Tube BID  . hydrocortisone sod succinate (SOLU-CORTEF) inj  50 mg Intravenous Q12H  . insulin aspart  0-9 Units Subcutaneous Q4H  . mouth  rinse  15 mL Mouth Rinse 10 times per day    Assessment/Plan:  Patient remains unresponsive.  On Keppra and Versed for myoclonus.  CTH x 2 no abnormalities found   - Worsening renal function  - Family appears to understand severity - Will order routine non contrast MRI of brain with possible.

## 2018-04-10 NOTE — Progress Notes (Addendum)
Name: Brian Boyle MRN: [id number] DOB: Feb 20, 1956     CONSULTATION DATE: 03/23/2018  Subjective & Objective: Declining renal function and urine out put.  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  has a past surgical history that includes Ventriculoperitoneal shunt (Left, 07/2013). Prior to Admission medications   Medication Sig Start Date End Date Taking? Authorizing Provider  BIKTARVY 50-200-25 MG TABS tablet Take 1 tablet by mouth daily. 03/19/18  Yes [provider]  cetirizine (ZYRTEC) 5 MG tablet Take 10 mg by mouth daily. 03/04/18  Yes [provider]  ELIQUIS 2.5 MG TABS tablet Take 1 tablet by mouth 2 (two) times daily. 03/22/18  Yes [provider]  fluticasone (FLONASE) 50 MCG/ACT nasal spray Place 1 spray into both nostrils daily. 02/04/18  Yes [provider]  furosemide (LASIX) 40 MG tablet Take 1 tablet by mouth daily. 01/16/18  Yes [provider]  LORazepam (ATIVAN) 0.5 MG tablet Take 2 tablets by mouth daily as needed. 01/14/18  Yes [provider]  losartan (COZAAR) 25 MG tablet Take 12.5 mg by mouth daily. 03/27/18  Yes [provider]  metoprolol succinate (TOPROL-XL) 25 MG 24 hr tablet Take 1 tablet by mouth daily. 01/16/18  Yes [provider]  Multiple Vitamin (MULTIVITAMIN) capsule Take 1 capsule by mouth 4 (four) times daily.   Yes [provider]  polyethylene glycol (MIRALAX / GLYCOLAX) packet Take 17 g by mouth daily as needed.   Yes [provider]  valACYclovir (VALTREX) 1000 MG tablet Take 2 tablets ( ) by mouth at onset of symptoms and repeat after 12 hours 02/04/18  Yes [provider]   No Known Allergies  FAMILY HISTORY:  family  history is not on file. SOCIAL HISTORY:  reports that he has never smoked. He has never used smokeless tobacco. He reports that he drank alcohol.  REVIEW OF SYSTEMS:   Unable to obtain due to critical illness   VITAL SIGNS: Temp:  [98.2 F (36.8 C)-100.4 F (38 C)] 100 F (37.8 C) (05/04 1200) Pulse Rate:  [66-82] 76 (05/04 1200) Resp:  [12-23] 18 (05/04 1200) BP: (82-113)/(57-82) 106/82 (05/04 1200) SpO2:  [90 %-100 %] 98 % (05/04 1200) Arterial Line BP: (101-225)/(57-79) 122/75 (05/04 1200) FiO2 (%):  [30 %-40 %] 35 % (05/04 1200) Weight:  [76.6 kg (168 lb 14 oz)] 76.6 kg (168 lb 14 oz) (05/04 0406)  Physical Examination:  On versed to stop Mycolonic activity. weak corneal , No Doll, no gag, no DTR, triggering the vent and no spontaneous movements. Withdrew to pain by knee flexion. Detailed neuro exam as per neurology. On vent, no distress, BEAE and no rales. S1 & S3 audible and no murmur Benign abdomen with feeble peristalses No leg edema  ASSESSMENT / PLAN:  Acute hypoxic respiratory failure. Poor weaning parameters -Monitor ABG,optimize vent settings and continue with vent support.  Anoxic encephalopathy s/p cardiac arrest. Rewarming after hypothermia -On Versed + Keppra for Myclonic seizure -Repeat CT head no evidence of hypoxic injury. MRI suspicious for anoxic encephalopathy. -Follow with neurology for the likelihood of having meaningful recovery.  Cardiogenic shock post cardiac arrest (VT vs Torsade de pointes) on Dopamin and Levophed. H/o CHF, LV mural thrombus with MI, non sustained VT -ECHO LV EF 25% with diffuse hypokinesis. -  Follow with cardiology.  Metabolic acidosis (improved) with lactic acidemia and sepsis -Off Bicarb. Gtt -Zosyn for total of 7 days as per ID. -Monitor lactic acid and Procal.  Pneumonia and atelectasis with aspiration. Res Cult Staph Aureus MSSA. MRSA PCR -ve. Worsening Rt. L  airspace disease. -Zosyn for total of 7 days as per  ID. Monitor CXR + CBC + FIO2 -Follow with ID for ABX recommendation  Oliguric AK (owrsening), hyperkalemia, GFR 8 with ATN due to sepsis and renal hypoperfusion -Temporizing agents for hyperkalemia. -Management as per renal and consider RRT if indicated. -Optimize hydration, hemodynamics, renal adjustment for meds,monitor renal panel and urine out put.  Elevated LFTs (improved)  with acute ischemic hepatitis s/p cardiac arrest. -Optimize hemodynamics, avoid hepatotoxins and monitor LFT.  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 -Follow with ID consult  Thrombocytopenia (worsening) -Monitor Plat.  Hypocalcemia -Replete and monitor electrolytes.  DNR  DVT & GI prophylaxis. Continue with supportive care  Critical care time 40 min

## 2018-04-10 NOTE — Progress Notes (Signed)
Plan for MRI brain at 1500 today.  Spoke to Dr. Loretha Brasil regarding concerns related to VP shunt.  Per MD, continue with MRI as scheduled.

## 2018-04-10 NOTE — Progress Notes (Signed)
[facility], [place] 04/10/18  Subjective:   Patient remains critically ill, intubated requiring ventilator support Serum creatinine remains critically high at 6.5 Continued on vent support Potassium is high at 5.8->5.1 Patient is anuric with urine output of 10 cc Currently on Versed drip.  Very minimal response. Anoxic brain injury is highly suspected.  Objective:  Vital signs in last 24 hours:  Temp:  [98.2 F (36.8 C)-100.4 F (38 C)] 99.9 F (37.7 C) (05/04 1500) Pulse Rate:  [70-82] 76 (05/04 1500) Resp:  [14-23] 23 (05/04 1500) BP: (88-113)/(57-85) 111/85 (05/04 1500) SpO2:  [90 %-100 %] 100 % (05/04 1500) Arterial Line BP: (103-225)/(60-79) 124/79 (05/04 1500) FiO2 (%):  [30 %-40 %] 35 % (05/04 1500) Weight:  [168 lb 14 oz (76.6 kg)] 168 lb 14 oz (76.6 kg) (05/04 0406)  Weight change: 9 lb 7.7 oz (4.3 kg) Filed Weights   04/08/18 0500 04/09/18 0500 04/10/18 0406  Weight: 150 lb 2.1 oz (68.1 kg) 159 lb 6.3 oz (72.3 kg) 168 lb 14 oz (76.6 kg)    Intake/Output:    Intake/Output Summary (Last 24 hours) at 04/10/2018 1548 Last data filed at 04/10/2018 1400 Gross per 24 hour  Intake 3514.71 ml  Output 10 ml  Net 3504.71 ml     Physical Exam: General: Critically ill appearing  HEENT  conjunctival edema  Neck Rt IJ CVL  Pulm/lungs Vent assisted, Fio 35%, PEEP 5  CVS/Heart Regular, no rub  Abdomen:  soft  Extremities: Arm edema b/l  Neurologic: On versed but no response to verbal or tactile stimulation  Skin: Extremities are cold to touch          Basic Metabolic Panel:  Recent Labs  Lab 04/08/18 0810 04/08/18 1650 04/09/18 0018 04/09/18 0459 04/09/18 1430 04/09/18 2021 04/10/18 0304 04/10/18 1419  NA 142 139 142 141  --   --  141  --   K 4.9 6.0* 5.8* 5.6* 5.8* 5.7* 5.8* 5.1  CL 94* 95* 97* 98*  --   --  98*  --   CO2 35* 30 31 29   --   --  25  --   GLUCOSE 108* 115* 123* 136*  --   --  132*  --   BUN 57* 65* 71* 75*   --   --  99*  --   CREATININE 4.24* 4.83* 5.21* 5.24*  --   --  6.48*  --   CALCIUM 6.5* 6.3* 6.5* 6.4*  --   --  6.3*  --   MG 1.9 2.0 2.1 2.0  --   --  2.1  --   PHOS 4.3 5.5* 6.8* 6.6*  --   --  6.1*  --      CBC: Recent Labs  Lab 03/25/2018 2146 04/07/18 0353 04/08/18 0404 04/09/18 0459 04/10/18 0304  WBC 15.4* 21.5* 24.1* 20.7* 15.5*  NEUTROABS 13.6*  --   --  19.6* 14.5*  HGB 14.5 15.3 14.0 13.1 12.6*  HCT 44.1 44.9 41.0 37.8* 36.6*  MCV 106.7* 106.6* 102.9* 103.8* 103.7*  PLT 143* 117* 69* 52* 39*     No results found for: HEPBSAG, HEPBSAB, HEPBIGM    Microbiology:  Recent Results (from the past 240 hour(s))  MRSA PCR Screening     Status: None   Collection Time: 03/31/2018 11:45 PM  Result Value Ref Range Status   MRSA by PCR NEGATIVE NEGATIVE Final    Comment:        The GeneXpert  MRSA Assay (FDA approved for NASAL specimens only), is one component of a comprehensive MRSA colonization surveillance program. It is not intended to diagnose MRSA infection nor to guide or monitor treatment for MRSA infections. Performed at [facility], [address] [place], [place] [address]   Urine Culture     Status: None   Collection Time: 03/27/2018 11:45 PM  Result Value Ref Range Status   Specimen Description   Final    URINE, RANDOM Performed at [facility], [address] [place], [place] [address]    Special Requests   Final    NONE Performed at [facility], [address], [place], [place] [address]    Culture   Final    NO GROWTH Performed at [facility], [place] [address] [place], [place] [address]    Report Status 04/08/2018 FINAL  Final  CULTURE, BLOOD (ROUTINE X 2) w Reflex to ID Panel     Status: None (Preliminary result)   Collection Time: 04/07/18  3:54 AM  Result Value Ref Range Status   Specimen Description BLOOD LEFT ANTECUBITAL  Final   Special Requests   Final    BOTTLES DRAWN AEROBIC AND ANAEROBIC Blood  Culture adequate volume   Culture   Final    NO GROWTH 3 DAYS Performed at [facility], [address], [place], [place] [address]    Report Status PENDING  Incomplete  CULTURE, BLOOD (ROUTINE X 2) w Reflex to ID Panel     Status: None (Preliminary result)   Collection Time: 04/07/18  4:05 AM  Result Value Ref Range Status   Specimen Description BLOOD LEFT WRIST  Final   Special Requests   Final    BOTTLES DRAWN AEROBIC AND ANAEROBIC Blood Culture adequate volume   Culture   Final    NO GROWTH 3 DAYS Performed at [facility], [address] [place], [place] [address]    Report Status PENDING  Incomplete  Culture, respiratory (NON-Expectorated)     Status: None   Collection Time: 04/07/18  5:36 AM  Result Value Ref Range Status   Specimen Description   Final    TRACHEAL ASPIRATE Performed at [facility], [address], [place], [place] [address]    Special Requests   Final    NONE Performed at [facility], [address] [place], [place] [address]    Gram Stain   Final    FEW WBC PRESENT, PREDOMINANTLY PMN FEW GRAM POSITIVE COCCI RARE GRAM NEGATIVE RODS    Culture   Final    MODERATE STAPHYLOCOCCUS AUREUS WITHIN NORMAL RESPIRATORY FLORA Performed at [facility], [place] [address] [place], [place] [address]    Report Status 04/10/2018 FINAL  Final   Organism ID, Bacteria STAPHYLOCOCCUS AUREUS  Final      Susceptibility   Staphylococcus aureus - MIC*    CIPROFLOXACIN >=8 RESISTANT Resistant     ERYTHROMYCIN <=0.25 SENSITIVE Sensitive     GENTAMICIN <=0.5 SENSITIVE Sensitive     OXACILLIN <=0.25 SENSITIVE Sensitive     TETRACYCLINE <=1 SENSITIVE Sensitive     VANCOMYCIN <=0.5 SENSITIVE Sensitive     TRIMETH/SULFA <=10 SENSITIVE Sensitive     CLINDAMYCIN <=0.25 SENSITIVE Sensitive     RIFAMPIN <=0.5 SENSITIVE Sensitive     Inducible Clindamycin NEGATIVE Sensitive     * MODERATE STAPHYLOCOCCUS AUREUS    Coagulation  Studies: No results for input(s): LABPROT, INR in the last 72  hours.  Urinalysis: No results for input(s): COLORURINE, LABSPEC, PHURINE, GLUCOSEU, HGBUR, BILIRUBINUR, KETONESUR, PROTEINUR, UROBILINOGEN, NITRITE, LEUKOCYTESUR in the last 72 hours.  Invalid input(s): APPERANCEUR    Imaging: Ct Head Wo Contrast  Result Date: 04/09/2018 CLINICAL DATA:  Cardiac arrest 3 days ago.  HIV EXAM: CT HEAD WITHOUT CONTRAST TECHNIQUE: Contiguous axial images were obtained from the base of the skull through the vertex without intravenous contrast. COMPARISON:  CT head 03/13/2018 FINDINGS: Brain: Left frontal ventricular shunt catheter unchanged in position in the midline posterior to the lateral ventricles. Slit-like left lateral ventricle unchanged. Right lateral ventricle not enlarged and unchanged. Hypodensity left frontal lobe at the site of catheter insertion is unchanged compatible with chronic encephalomalacia. No acute infarct, hemorrhage, or mass. Vascular: Negative for hyperdense vessel Skull: Negative Sinuses/Orbits: Mild mucosal edema paranasal sinuses.  Normal orbit Other: None IMPRESSION: Left frontal shunt catheter unchanged.  Negative for hydrocephalus. Negative for acute infarct or hemorrhage. No evidence of hypoxic injury. Electronically Signed   By: Franchot Gallo M.D.   On: 04/09/2018 11:41   Dg Chest Port 1 View  Result Date: 04/10/2018 CLINICAL DATA:  Pneumonia. EXAM: PORTABLE CHEST 1 VIEW COMPARISON:  Chest x-ray from yesterday. FINDINGS: Unchanged positioning of the endotracheal tube, enteric tube, and right internal jugular central venous catheter. 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 Stable cardiomediastinal silhouette. Hazy opacity in the right mid to lower lung appears slightly worse when compared to prior study. Unchanged small left pleural effusion and left basilar atelectasis. No pneumothorax. No acute osseous abnormality. IMPRESSION: 1. Slightly  worsened hazy opacity in the right mid to lower lung which may reflect a combination of layering pleural fluid and edema/infiltrate. Electronically Signed   By: Titus Dubin M.D.   On: 04/10/2018 07:13   Dg Chest Port 1 View  Result Date: 04/09/2018 CLINICAL DATA:  Pneumonia. EXAM: PORTABLE CHEST 1 VIEW COMPARISON:  Radiograph of Apr 08, 2018. FINDINGS: Stable cardiomegaly. Endotracheal and nasogastric tubes are unchanged in position. Right internal jugular catheter is unchanged in position. Left pleural effusion appears to be smaller compared to prior exam with associated atelectasis. Stable right lower lobe opacity is noted concerning for edema or pneumonia. Bony thorax is unremarkable. IMPRESSION: Stable support apparatus. Stable right basilar opacity. Improved left pleural effusion with associated atelectasis. Electronically Signed   By: Marijo Conception, M.D.   On: 04/09/2018 07:12     Medications:   . sodium chloride Stopped (04/07/18 2000)  . calcium gluconate    . dextrose 5 % and 0.9% NaCl Stopped (04/10/18 1520)  . DOPamine Stopped (04/08/18 1826)  . famotidine (PEPCID) IV Stopped (04/09/18 2237)  . feeding supplement (VITAL AF 1.2 CAL) Stopped (04/10/18 1520)  . fentaNYL infusion INTRAVENOUS Stopped (04/08/18 0900)  . levETIRAcetam Stopped (04/10/18 1140)  . midazolam (VERSED) infusion 5 mg/hr (04/10/18 1520)  . norepinephrine (LEVOPHED) Adult infusion Stopped (04/10/18 0855)  . phenylephrine (NEO-SYNEPHRINE) Adult infusion Stopped (04/07/18 0745)  . piperacillin-tazobactam (ZOSYN)  IV 3.375 g (04/10/18 1415)  . vasopressin (PITRESSIN) infusion - *FOR SHOCK* Stopped (04/07/18 0174)   . chlorhexidine gluconate (MEDLINE KIT)  15 mL Mouth Rinse BID  . docusate  100 mg Per Tube BID  . insulin aspart  0-9 Units Subcutaneous Q4H  . mouth rinse  15 mL Mouth Rinse 10 times per day   acetaminophen **OR** acetaminophen, fentaNYL, sodium chloride flush  Assessment/ Plan:  62 y.o. male   with a PMHX of HIV, chronic  systolic congestive heart failure, left Vent mural thrombus, anxiety/depression, cryptococcal meningitis history, melanoma, was admitted on 04/04/2018   1.  Acute renal failure,oliguric 2.  Acute respiratory failure 3.  Acute MI, cardiogenic shock 4.  Hyperkalemia  Patient is critically ill requiring ventilatory and hemodynamic support.  He has cardiogenic shock due to acute MI.  Not a candidate for heart catheterization due to unclear neurological prognosis.  According to critical care notes, patient's family wants to hold off renal replacement therapy until neurological prognosis is more clear.  We will continue to follow closely.      LOS: 4 Jakira Mcfadden 5/4/20193:48 PM  [facility] [place], [place]  Note: This note was prepared with Dragon dictation. Any transcription errors are unintentional

## 2018-04-10 NOTE — Progress Notes (Addendum)
Per patient's mother, Jerrol Banana, patient had VP shunt placed at [facility] by Dr. Orlene Erm with Infectious Disease sometime in 1999.  Patient still continues to follow up with Dr. Servando Snare at [facility].  If further information is needed, contact Dr. Annabell Sabal office at [phone].

## 2018-04-11 ENCOUNTER — Inpatient Hospital Stay: Payer: Medicare HMO

## 2018-04-11 LAB — CBC WITH DIFFERENTIAL/PLATELET
BASOS ABS: 0 10*3/uL (ref 0–0.1)
BASOS PCT: 0 %
EOS ABS: 0 10*3/uL (ref 0–0.7)
Eosinophils Relative: 0 %
HCT: 33.1 % — ABNORMAL LOW (ref 40.0–52.0)
HEMOGLOBIN: 11.4 g/dL — AB (ref 13.0–18.0)
Lymphocytes Relative: 4 %
Lymphs Abs: 0.5 10*3/uL — ABNORMAL LOW (ref 1.0–3.6)
MCH: 36 pg — AB (ref 26.0–34.0)
MCHC: 34.6 g/dL (ref 32.0–36.0)
MCV: 104.1 fL — ABNORMAL HIGH (ref 80.0–100.0)
Monocytes Absolute: 0.8 10*3/uL (ref 0.2–1.0)
Monocytes Relative: 6 %
NEUTROS PCT: 90 %
Neutro Abs: 12.2 10*3/uL — ABNORMAL HIGH (ref 1.4–6.5)
PLATELETS: 48 10*3/uL — AB (ref 150–440)
RBC: 3.18 MIL/uL — ABNORMAL LOW (ref 4.40–5.90)
RDW: 13.6 % (ref 11.5–14.5)
WBC: 13.6 10*3/uL — ABNORMAL HIGH (ref 3.8–10.6)

## 2018-04-11 LAB — COMPREHENSIVE METABOLIC PANEL
ALBUMIN: 2.1 g/dL — AB (ref 3.5–5.0)
ALK PHOS: 107 U/L (ref 38–126)
ALT: 1172 U/L — ABNORMAL HIGH (ref 17–63)
AST: 432 U/L — AB (ref 15–41)
Anion gap: 17 — ABNORMAL HIGH (ref 5–15)
BUN: 126 mg/dL — AB (ref 6–20)
CALCIUM: 6.4 mg/dL — AB (ref 8.9–10.3)
CHLORIDE: 101 mmol/L (ref 101–111)
CO2: 25 mmol/L (ref 22–32)
CREATININE: 7.69 mg/dL — AB (ref 0.61–1.24)
GFR calc non Af Amer: 7 mL/min — ABNORMAL LOW (ref >60)
GFR, EST AFRICAN AMERICAN: 8 mL/min — AB (ref >60)
GLUCOSE: 150 mg/dL — AB (ref 65–99)
Potassium: 4.8 mmol/L (ref 3.5–5.1)
Sodium: 143 mmol/L (ref 135–145)
Total Bilirubin: 1 mg/dL (ref 0.3–1.2)
Total Protein: 4.1 g/dL — ABNORMAL LOW (ref 6.5–8.1)

## 2018-04-11 LAB — MAGNESIUM: Magnesium: 2.3 mg/dL (ref 1.7–2.4)

## 2018-04-11 LAB — PHOSPHORUS: PHOSPHORUS: 5.7 mg/dL — AB (ref 2.5–4.6)

## 2018-04-11 LAB — GLUCOSE, CAPILLARY
GLUCOSE-CAPILLARY: 149 mg/dL — AB (ref 65–99)
GLUCOSE-CAPILLARY: 108 mg/dL — AB (ref 65–99)
Glucose-Capillary: 107 mg/dL — ABNORMAL HIGH (ref 65–99)
Glucose-Capillary: 109 mg/dL — ABNORMAL HIGH (ref 65–99)
Glucose-Capillary: 117 mg/dL — ABNORMAL HIGH (ref 65–99)

## 2018-04-11 LAB — POTASSIUM
POTASSIUM: 5.1 mmol/L (ref 3.5–5.1)
POTASSIUM: 4.8 mmol/L (ref 3.5–5.1)
Potassium: 5 mmol/L (ref 3.5–5.1)
Potassium: 4.9 mmol/L (ref 3.5–5.1)

## 2018-04-11 LAB — CALCIUM, IONIZED: CALCIUM, IONIZED, SERUM: 3.5 mg/dL — AB (ref 4.5–5.6)

## 2018-04-11 MED ORDER — SODIUM CHLORIDE 0.9 % IV SOLN
500.0000 mg | Freq: Two times a day (BID) | INTRAVENOUS | Status: DC
  Administered 2018-04-12: 500 mg via INTRAVENOUS
  Filled 2018-04-11 (×3): qty 5

## 2018-04-11 MED ORDER — SODIUM CHLORIDE 0.9 % IV SOLN
1.0000 g | Freq: Once | INTRAVENOUS | Status: AC
  Administered 2018-04-11: 1 g via INTRAVENOUS
  Filled 2018-04-11: qty 10

## 2018-04-11 MED ORDER — LEVETIRACETAM IN NACL 500 MG/100ML IV SOLN
500.0000 mg | Freq: Two times a day (BID) | INTRAVENOUS | Status: DC
  Administered 2018-04-11: 500 mg via INTRAVENOUS
  Filled 2018-04-11 (×3): qty 100

## 2018-04-11 MED ORDER — DM-GUAIFENESIN ER 30-600 MG PO TB12: 1.0000 | ORAL_TABLET | Freq: Two times a day (BID) | ORAL | Status: DC

## 2018-04-11 NOTE — Progress Notes (Signed)
Pharmacy Antibiotic Note  Brian Boyle is a 62 y.o. male admitted on 04/29/18 s/p cardiac arrest and placed on TTM protocol.  Pharmacy has been consulted for Zosyn dosing for possible aspiration.   Plan: Day 5- Zosyn EI 3.375g IV Q12hr. Per ID will do a total of 7 days of therapy.   Height:  (175.3 cm) Weight: 167 lb 1.7 oz (75.8 kg) IBW/kg (Calculated) : 70.7  Temp (24hrs), Avg:99 F (37.2 C), Min:98 F (36.7 C), Max:99.9 F (37.7 C)  Recent Labs  Lab 04/07/18 0353  04/07/18 1114  04/08/18 0404  04/08/18 0944 04/08/18 1210 04/08/18 1650 04/09/18 0018 04/09/18 0459 04/10/18 0304 04/10/18 1419 04/10/18 1726 04/11/18 0509  WBC 21.5*  --   --   --  24.1*  --   --   --   --   --  20.7* 15.5*  --   --  13.6*  CREATININE 2.63*   < >  --    < >  --    < >  --   --  4.83* 5.21* 5.24* 6.48*  --   --  7.69*  LATICACIDVEN  --    < > 9.3*  --   --   --  2.9* 2.9*  --   --   --   --  1.6 1.3  --    < > = values in this interval not displayed.    Estimated Creatinine Clearance: 10.3 mL/min (A) (by C-G formula based on SCr of 7.69 mg/dL (H)).    No Known Allergies  Antimicrobials this admission: Azithromycin and CTX x 1 Zosyn 5/1 >>  Vancomycin 5/2 x 1  Dose adjustments this admission:   Microbiology results: 5/1 BCx: No growth x 2 days  4/30 UCx: No growth  5/1 TA: moderate staph  4/30 MRSA PCR: negative  Thank you for allowing pharmacy to be a part of this patient's care.  Bari Mantis PharmD Clinical Pharmacist 04/11/2018

## 2018-04-11 NOTE — Progress Notes (Signed)
[facility] [place] at [facility]   PATIENT NAME: Brian Boyle    MR#:  [id number]  DATE OF BIRTH:  12-Feb-1956  SUBJECTIVE:  Patient seen and evaluated today He is on ventilator for respiratory support Patient continues to be comatose, case discussed with intensivist-patient remains critically ill with poor prognosis, neurology/cardiology input appreciated Unresposive  REVIEW OF SYSTEMS:  Could not be obtained as patient is comatose and on ventilator ROS  DRUG ALLERGIES:  No Known Allergies  VITALS:  Blood pressure 114/66, pulse (!) 57, temperature 98.1 F (36.7 C), temperature source Oral, resp. rate 20, height  (1.753 m), weight 75.8 kg (167 lb 1.7 oz), SpO2 97 %.  PHYSICAL EXAMINATION:  GENERAL:  62 y.o.-year-old patient lying in the bed on ventilator Ventilator settings Tidal volume 500 Rate 20 PEEP 5 FiO2 35% EYES: Pupils dilated, decreased response to light and accommodation. No scleral icterus. Extraocular muscles intact.  HEENT: Head atraumatic, normocephalic. Oropharynx has endotracheal tube NECK:  Supple, no jugular venous distention. No thyroid enlargement, no tenderness.  LUNGS: Decreased breath sounds bilaterally, scattered rales in both lungs. No use of accessory muscles of respiration.  CARDIOVASCULAR: S1, S2 normal. No murmurs, rubs, or gallops.  ABDOMEN: Soft, nontender, nondistended. Bowel sounds present. No organomegaly or mass.  EXTREMITIES: No pedal edema, cyanosis, or clubbing.  NEUROLOGIC: Comatose Decreased responsiveness Complete exam not possible PSYCHIATRIC: could not be assessed SKIN: No obvious rash, lesion, or ulcer.   Physical Exam LABORATORY PANEL:   CBC Recent Labs  Lab 04/11/18 0509  WBC 13.6*  HGB 11.4*  HCT 33.1*  PLT 48*   ------------------------------------------------------------------------------------------------------------------  Chemistries  Recent Labs  Lab 04/11/18 0509 04/11/18 0821   NA 143  --   K 4.8 5.0  CL 101  --   CO2 25  --   GLUCOSE 150*  --   BUN 126*  --   CREATININE 7.69*  --   CALCIUM 6.4*  --   MG 2.3  --   AST 432*  --   ALT 1,172*  --   ALKPHOS 107  --   BILITOT 1.0  --    ------------------------------------------------------------------------------------------------------------------  Cardiac Enzymes Recent Labs  Lab 04/07/18 1545 04/08/18 0031  TROPONINI >65.00* >65.00*   ------------------------------------------------------------------------------------------------------------------  RADIOLOGY:  Ct Head Wo Contrast  Result Date: 04/09/2018 CLINICAL DATA:  Cardiac arrest 3 days ago.  HIV EXAM: CT HEAD WITHOUT CONTRAST TECHNIQUE: Contiguous axial images were obtained from the base of the skull through the vertex without intravenous contrast. COMPARISON:  CT head 04/05/2018 FINDINGS: Brain: Left frontal ventricular shunt catheter unchanged in position in the midline posterior to the lateral ventricles. Slit-like left lateral ventricle unchanged. Right lateral ventricle not enlarged and unchanged. Hypodensity left frontal lobe at the site of catheter insertion is unchanged compatible with chronic encephalomalacia. No acute infarct, hemorrhage, or mass. Vascular: Negative for hyperdense vessel Skull: Negative Sinuses/Orbits: Mild mucosal edema paranasal sinuses.  Normal orbit Other: None IMPRESSION: Left frontal shunt catheter unchanged.  Negative for hydrocephalus. Negative for acute infarct or hemorrhage. No evidence of hypoxic injury. Electronically Signed   By: Marlan Palau M.D.   On: 04/09/2018 11:41   Mr Brain Wo Contrast  Result Date: 04/10/2018 CLINICAL DATA:  Initial evaluation for cardiac arrest, unresponsive for 40 minutes. Evaluate for anoxic brain injury. EXAM: MRI HEAD WITHOUT CONTRAST TECHNIQUE: Multiplanar, multiecho pulse sequences of the brain and surrounding structures were obtained without intravenous contrast. COMPARISON:   Prior CT from 04/09/2018. FINDINGS: Brain:  There is subtle mildly hyperintense T2/FLAIR hyperintensity involving the ventral medial thalami bilaterally, with extension into the overlying perirolandic cortices. Corresponding mild diffusion abnormality within these regions (series 100, image 30, 42). Inferior extension into the central midbrain with abnormal T2/FLAIR signal in diffusion abnormality within the bilateral cerebellar hemispheres (series 100, image 10). Given provided history, findings suspicious for a [place] and brain injury. Age indeterminate susceptibility artifact within the cerebellar vermis and bilateral cerebellar hemispheres on gradient echo sequence noted, which could reflect associated petechial hemorrhage (series 9, image 7, 10). No evidence for acute vascular infarct. Gray-white matter differentiation otherwise maintained. No other evidence for acute or chronic intracranial hemorrhage. No mass lesion, mass effect, or midline shift. Left frontal approach shunt catheter in place with tip near the posterior septum pellucidum. Left lateral ventricle asymmetrically decompressed as compared to the right. No hydrocephalus or ventricular trapping. No extra-axial collections. Major dural sinuses grossly patent. Pituitary gland within normal limits. Midline structures intact and normal. Vascular: Major intracranial vascular flow voids are maintained. Skull and upper cervical spine: Craniocervical junction within normal limits. Upper cervical spine normal. Bone marrow signal intensity diffusely decreased on T1 weighted imaging, most commonly related to anemia, smoking, obesity, or other chronic disease. Reservoir for shunt catheter present at the left frontal scalp. Sinuses/Orbits: Globes and orbital soft tissues within normal limits. Scattered mucosal thickening within the paranasal sinuses. Fluid seen layering within the nasopharynx. Patient is intubated. Bilateral mastoid effusions noted. Other: None.  IMPRESSION: 1. Subtle T2/FLAIR and diffusion abnormality involving the bilateral perirolandic cortices, thalami, midbrain, and bilateral cerebellar hemispheres, highly suspicious for anoxic brain injury given the provided history. See above. 2. Left frontal approach shunt catheter in place with stable ventricular size. No hydrocephalus. Electronically Signed   By: Rise Mu M.D.   On: 04/10/2018 17:46   Dg Chest Port 1 View  Result Date: 04/11/2018 CLINICAL DATA:  62 year old male with history of pneumonia. EXAM: PORTABLE CHEST 1 VIEW COMPARISON:  Chest x-ray 04/10/2018. FINDINGS: An endotracheal tube is in place with tip 3.5 cm above the carina. There is a right-sided internal jugular central venous catheter with tip terminating in the mid superior vena cava. A nasogastric tube is seen extending into the stomach, however, the tip of the nasogastric tube extends below the lower margin of the image. Lung volumes are slightly low. Persistent bibasilar opacities may reflect areas of atelectasis and/or airspace consolidation. Small bilateral pleural effusions. No definite evidence of pulmonary edema. Heart size is upper limits of normal, accentuated by portable AP technique. IMPRESSION: 1. Support apparatus, as above. 2. Persistent bibasilar opacities (left greater than right), which may reflect areas of atelectasis and/or airspace consolidation, with superimposed small bilateral pleural effusions. Electronically Signed   By: Trudie Reed M.D.   On: 04/11/2018 07:14   Dg Chest Port 1 View  Result Date: 04/10/2018 CLINICAL DATA:  Pneumonia. EXAM: PORTABLE CHEST 1 VIEW COMPARISON:  Chest x-ray from yesterday. FINDINGS: Unchanged positioning of the endotracheal tube, enteric tube, and right internal jugular central venous catheter. 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 Stable cardiomediastinal silhouette. Hazy opacity in the right mid to lower lung appears slightly  worse when compared to prior study. Unchanged small left pleural effusion and left basilar atelectasis. No pneumothorax. No acute osseous abnormality. IMPRESSION: 1. Slightly worsened hazy opacity in the right mid to lower lung which may reflect a combination of layering pleural fluid and edema/infiltrate. Electronically Signed   By: Vickki Hearing.D.  On: 04/10/2018 07:13    ASSESSMENT AND PLAN:  *Acute cardiac arrest with cardiogenic shock Prolonged resuscitation effort Continue ventilatory management, vasopressor support with Levophed/Neo-Synephrine/vasopressin with weaning as tolerated, cardiology input appreciated Echocardiogram noted for ejection fraction 25-29%/stage II diastolic dysfunction/moderate pulmonary hypertension  *Acute hypoxic respiratory failure  Secondary to acute cardiac arrest  Continue vent management   *Acute non-STEMI Secondary to acute cardiac arrest Cardiology input appreciated Continue supportive care, vasopressor support per above, echocardiogram noted above  *Acute metabolic acidosis Secondary to above Continue avoid nephrotoxic agents  *Acute renal failure Oliguric Continue to avoid nephrotoxic agents, strict I&O monitoring, daily weights, BMP daily Nephrology follow-up appreciated  *Chronic HIV infection Infectious disease input appreciated has history of HIV for a long time, history of cryptococcal meningitis in 2014,patient supposed to be on GENYOVA   *Acute encephalopathy Most likely secondary to the above cardiac arrest with respiratory failure Neurology input appreciated CT head negative for any acute process, shunt noted Started on Keppra for myoclonic jerks, EEG is pending  *Acute shock liver  Most likely secondary to acute cardiac arrest/hypotension  stable  Continue to avoid hepatotoxic agents Follow-up liver function tests  *Hyperkalemia improved  *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 Subtle T2/FLAIR and diffusion abnormality involving the bilateral perirolandic cortices, thalami, midbrain, and bilateral cerebellar hemispheres, highly suspicious for anoxic brain injury given the provided history. See above. 2. Left frontal approach shunt catheter in place with stable ventricular size. No hydrocephalus.   *Condition guarded Long-term prognosis poor Disposition pending clinical course condition guarded   All the records are reviewed and case discussed with Care Management/Social Workerr. Management plans discussed with the patient, family and they are in agreement.  CODE STATUS: DNR  TOTAL TIME TAKING CARE OF THIS PATIENT: 36 minutes.     POSSIBLE D/C IN 2-5 DAYS, DEPENDING ON CLINICAL CONDITION.   Ihor Austin M.D on 04/11/2018   Between 7am to 6pm - Pager - [phone]  After 6pm go to [email or web address] - password [email or web address]  [facility]  Office  [phone]  CC: Primary care physician; Patient, No Pcp Per  Note: This dictation was prepared with Dragon dictation along with smaller phrase technology. Any transcriptional errors that result from this process are unintentional.

## 2018-04-11 NOTE — Progress Notes (Signed)
Name: Robbert Langlinais MRN: [id number] DOB: 12-04-1956     CONSULTATION DATE: 03/22/2018  Subjective & Objective: On vent, worsening neuro exam, off pressers and continues to have myoclonus when stimulated.  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  has a past surgical history that includes Ventriculoperitoneal shunt (Left, 07/2013). Prior to Admission medications   Medication Sig Start Date End Date Taking? Authorizing Provider  BIKTARVY 50-200-25 MG TABS tablet Take 1 tablet by mouth daily. 03/19/18  Yes [provider]  cetirizine (ZYRTEC) 5 MG tablet Take 10 mg by mouth daily. 03/04/18  Yes [provider]  ELIQUIS 2.5 MG TABS tablet Take 1 tablet by mouth 2 (two) times daily. 03/22/18  Yes [provider]  fluticasone (FLONASE) 50 MCG/ACT nasal spray Place 1 spray into both nostrils daily. 02/04/18  Yes [provider]  furosemide (LASIX) 40 MG tablet Take 1 tablet by mouth daily. 01/16/18  Yes [provider]  LORazepam (ATIVAN) 0.5 MG tablet Take 2 tablets by mouth daily as needed. 01/14/18  Yes [provider]  losartan (COZAAR) 25 MG tablet Take 12.5 mg by mouth daily. 03/27/18  Yes [provider]  metoprolol succinate (TOPROL-XL) 25 MG 24 hr tablet Take 1 tablet by mouth daily. 01/16/18  Yes [provider]  Multiple Vitamin (MULTIVITAMIN) capsule Take 1 capsule by mouth 4 (four) times daily.   Yes [provider]  polyethylene glycol (MIRALAX / GLYCOLAX) packet Take 17 g by mouth daily as needed.   Yes [provider]  valACYclovir (VALTREX) 1000 MG tablet Take 2 tablets ( ) by mouth at onset of symptoms and repeat after 12 hours 02/04/18  Yes [provider]    No Known Allergies  FAMILY HISTORY:  family history is not on file. SOCIAL HISTORY:  reports that he has never smoked. He has never used smokeless tobacco. He reports that he drank alcohol.  REVIEW OF SYSTEMS:   Unable to obtain due to critical illness   VITAL SIGNS: Temp:  [98 F (36.7 C)-99.9 F (37.7 C)] 98.8 F (37.1 C) (05/05 1200) Pulse Rate:  [49-93] 53 (05/05 1600) Resp:  [11-35] 20 (05/05 1600) BP: (91-134)/(58-80) 100/61 (05/05 1600) SpO2:  [61 %-100 %] 97 % (05/05 1600) Arterial Line BP: (96-189)/(56-97) 108/60 (05/05 1600) FiO2 (%):  [35 %] 35 % (05/05 1600) Weight:  [75.8 kg (167 lb 1.7 oz)] 75.8 kg (167 lb 1.7 oz) (05/05 0421)  Physical Examination:  On versed to stop Mycolonic activity.weakcorneal , No Doll, no gag, no DTR, not triggering the vent and no spontaneous movements. Responds to stimulation by myoclonus. Detailed neuro exam as per neurology. On vent, no distress, BEAE and no rales. S1 & S3 audible and no murmur Benign abdomen with feeble peristalses No leg edema  ASSESSMENT / PLAN: Acute hypoxic respiratory failure. Poor weaning parameters -Monitor ABG,optimize vent settings and continue with vent support.  Anoxic encephalopathy s/p cardiac arrest. Rewarming after hypothermia -On Versed + Keppra forMyclonic seizure -Repeat CT head no evidence of hypoxic injury. MRI suspicious for anoxic encephalopathy. -Case was discussed with neurology, patient is unlikely to have meaningful recovery. The mother was notified by Dr. Loretha Brasil and plan to withdraw support in am.  Cardiogenic shock post cardiac arrest (VT vs Torsade de pointes) off Dopamin and  off Levophed. H/o CHF, LV mural thrombus with MI, non sustained VT -ECHO LV EF 25% with diffuse hypokinesis. -Follow with cardiology.  Metabolic acidosis (improved) with lactic acidemia and sepsis -Off Bicarb. Gtt -Zosyn for total of 7 days as per ID. -Monitor lactic acid and Procal.  Pneumonia  and atelectasis with aspiration. Res CultStaph Aureus MSSA. MRSA PCR -ve. Worsening Rt. L  airspace disease. -Zosyn for total of 7 days as per ID. Monitor CXR + CBC + FIO2 -Follow with ID for ABX recommendation  AnuricAK(owrsening), hyperkalemia, GFR 8 with ATN due to sepsis and renal hypoperfusion -Temporizing agents for hyperkalemia. -Management as per renal, unlikely to have meaningful recovery and not candidate for RRT as discussed with renal. -Optimize hydration, hemodynamics, renal adjustment for meds,monitor renal panel and urine out put.  Elevated LFTs(improved)with acute ischemic hepatitis s/p cardiac arrest. -Optimize hemodynamics, avoid hepatotoxins and monitor LFT.  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 -Follow with ID consult  Anemia -Keep HB > 7 gm/dl.  Thrombocytopenia(worsening) -Monitor Plat.  Hypocalcemia -Replete and monitor electrolytes.  DNR  DVT & GI prophylaxis. Continue with supportive care  Critical care time 40 min

## 2018-04-11 NOTE — Progress Notes (Signed)
[facility], [place] 04/11/18  Subjective:   Patient remains critically ill, intubated requiring ventilator support Serum creatinine remains critically high at 7.7 Patient is anuric with urine output of 10 cc Continued on vent support, Fio2 35%, PEEP 5 Potassium is in normal range now Tube feeds at 55 cc/hr Currently on Versed, fentanyl drip.  Very minimal response. Anoxic brain injury is highly suspected.  Objective:  Vital signs in last 24 hours:  Temp:  [98 F (36.7 C)-100.4 F (38 C)] 98.1 F (36.7 C) (05/05 0800) Pulse Rate:  [52-93] 57 (05/05 0800) Resp:  [11-35] 20 (05/05 0800) BP: (91-134)/(58-85) 114/66 (05/05 0800) SpO2:  [61 %-100 %] 97 % (05/05 0800) Arterial Line BP: (96-189)/(56-97) 120/71 (05/05 0800) FiO2 (%):  [35 %] 35 % (05/05 0800) Weight:  [167 lb 1.7 oz (75.8 kg)] 167 lb 1.7 oz (75.8 kg) (05/05 0421)  Weight change: -1 lb 12.2 oz (-0.8 kg) Filed Weights   04/09/18 0500 04/10/18 0406 04/11/18 0421  Weight: 159 lb 6.3 oz (72.3 kg) 168 lb 14 oz (76.6 kg) 167 lb 1.7 oz (75.8 kg)    Intake/Output:    Intake/Output Summary (Last 24 hours) at 04/11/2018 0910 Last data filed at 04/11/2018 0800 Gross per 24 hour  Intake 2692.78 ml  Output -  Net 2692.78 ml     Physical Exam: General: Critically ill appearing  HEENT  conjunctival edema, pupils are round, small  Neck Rt IJ CVL  Pulm/lungs Vent assisted, Clear b/l  CVS/Heart Regular, no rub  Abdomen:  soft  Extremities: Arm edema b/l  Neurologic: On sedation but no response to verbal or tactile stimulation  Skin: Extremities are cool to touch          Basic Metabolic Panel:  Recent Labs  Lab 04/08/18 1650 04/09/18 0018 04/09/18 0459  04/10/18 0304 04/10/18 1419 04/10/18 2007 04/11/18 0255 04/11/18 0509 04/11/18 0821  NA 139 142 141  --  141  --   --   --  143  --   K 6.0* 5.8* 5.6*   < > 5.8* 5.1 5.2* 5.1 4.8 5.0  CL 95* 97* 98*  --  98*  --   --   --  101  --    CO2 _0 --  25  --   --   --  25  --   GLUCOSE 115* 123* 136*  --  132*  --   --   --  150*  --   BUN 65* 71* 75*  --  99*  --   --   --  126*  --   CREATININE 4.83* 5.21* 5.24*  --  6.48*  --   --   --  7.69*  --   CALCIUM 6.3* 6.5* 6.4*  --  6.3*  --   --   --  6.4*  --   MG 2.0 2.1 2.0  --  2.1  --   --   --  2.3  --   PHOS 5.5* 6.8* 6.6*  --  6.1*  --   --   --  5.7*  --    < > = values in this interval not displayed.     CBC: Recent Labs  Lab 03/13/2018 2146 04/07/18 0353 04/08/18 0404 04/09/18 0459 04/10/18 0304 04/11/18 0509  WBC 15.4* 21.5* 24.1* 20.7* 15.5* 13.6*  NEUTROABS 13.6*  --   --  19.6* 14.5* 12.2*  HGB 14.5 15.3 14.0 13.1 12.6*  11.4*  HCT 44.1 44.9 41.0 37.8* 36.6* 33.1*  MCV 106.7* 106.6* 102.9* 103.8* 103.7* 104.1*  PLT 143* 117* 69* 52* 39* 48*     No results found for: HEPBSAG, HEPBSAB, HEPBIGM    Microbiology:  Recent Results (from the past 240 hour(s))  MRSA PCR Screening     Status: None   Collection Time: 03/31/2018 11:45 PM  Result Value Ref Range Status   MRSA by PCR NEGATIVE NEGATIVE Final    Comment:        The GeneXpert MRSA Assay (FDA approved for NASAL specimens only), is one component of a comprehensive MRSA colonization surveillance program. It is not intended to diagnose MRSA infection nor to guide or monitor treatment for MRSA infections. Performed at [facility], [address], [place], [place] [address]   Urine Culture     Status: None   Collection Time: 03/11/2018 11:45 PM  Result Value Ref Range Status   Specimen Description   Final    URINE, RANDOM Performed at [facility], [address], [place], [place] [address]    Special Requests   Final    NONE Performed at [facility], [address] [place], [place] [address]    Culture   Final    NO GROWTH Performed at [facility], [place] [address] [place], [place] [address]    Report Status 04/08/2018 FINAL  Final   CULTURE, BLOOD (ROUTINE X 2) w Reflex to ID Panel     Status: None (Preliminary result)   Collection Time: 04/07/18  3:54 AM  Result Value Ref Range Status   Specimen Description BLOOD LEFT ANTECUBITAL  Final   Special Requests   Final    BOTTLES DRAWN AEROBIC AND ANAEROBIC Blood Culture adequate volume   Culture   Final    NO GROWTH 4 DAYS Performed at [facility], [address], [place], [place] [address]    Report Status PENDING  Incomplete  CULTURE, BLOOD (ROUTINE X 2) w Reflex to ID Panel     Status: None (Preliminary result)   Collection Time: 04/07/18  4:05 AM  Result Value Ref Range Status   Specimen Description BLOOD LEFT WRIST  Final   Special Requests   Final    BOTTLES DRAWN AEROBIC AND ANAEROBIC Blood Culture adequate volume   Culture   Final    NO GROWTH 4 DAYS Performed at [facility], [address] [place], [place] [address]    Report Status PENDING  Incomplete  Culture, respiratory (NON-Expectorated)     Status: None   Collection Time: 04/07/18  5:36 AM  Result Value Ref Range Status   Specimen Description   Final    TRACHEAL ASPIRATE Performed at [facility], [address] [place], [place] [address]    Special Requests   Final    NONE Performed at [facility], [address], [place], [place] [address]    Gram Stain   Final    FEW WBC PRESENT, PREDOMINANTLY PMN FEW GRAM POSITIVE COCCI RARE GRAM NEGATIVE RODS    Culture   Final    MODERATE STAPHYLOCOCCUS AUREUS WITHIN NORMAL RESPIRATORY FLORA Performed at [facility], [place] [address], [place], [place] [address]    Report Status 04/10/2018 FINAL  Final   Organism ID, Bacteria STAPHYLOCOCCUS AUREUS  Final      Susceptibility   Staphylococcus aureus - MIC*    CIPROFLOXACIN >=8 RESISTANT Resistant  ERYTHROMYCIN <=0.25 SENSITIVE Sensitive     GENTAMICIN <=0.5 SENSITIVE Sensitive     OXACILLIN <=0.25 SENSITIVE Sensitive     TETRACYCLINE <=1  SENSITIVE Sensitive     VANCOMYCIN <=0.5 SENSITIVE Sensitive     TRIMETH/SULFA <=10 SENSITIVE Sensitive     CLINDAMYCIN <=0.25 SENSITIVE Sensitive     RIFAMPIN <=0.5 SENSITIVE Sensitive     Inducible Clindamycin NEGATIVE Sensitive     * MODERATE STAPHYLOCOCCUS AUREUS    Coagulation Studies: No results for input(s): LABPROT, INR in the last 72 hours.  Urinalysis: No results for input(s): COLORURINE, LABSPEC, PHURINE, GLUCOSEU, HGBUR, BILIRUBINUR, KETONESUR, PROTEINUR, UROBILINOGEN, NITRITE, LEUKOCYTESUR in the last 72 hours.  Invalid input(s): APPERANCEUR    Imaging: Ct Head Wo Contrast  Result Date: 04/09/2018 CLINICAL DATA:  Cardiac arrest 3 days ago.  HIV EXAM: CT HEAD WITHOUT CONTRAST TECHNIQUE: Contiguous axial images were obtained from the base of the skull through the vertex without intravenous contrast. COMPARISON:  CT head 04/05/2018 FINDINGS: Brain: Left frontal ventricular shunt catheter unchanged in position in the midline posterior to the lateral ventricles. Slit-like left lateral ventricle unchanged. Right lateral ventricle not enlarged and unchanged. Hypodensity left frontal lobe at the site of catheter insertion is unchanged compatible with chronic encephalomalacia. No acute infarct, hemorrhage, or mass. Vascular: Negative for hyperdense vessel Skull: Negative Sinuses/Orbits: Mild mucosal edema paranasal sinuses.  Normal orbit Other: None IMPRESSION: Left frontal shunt catheter unchanged.  Negative for hydrocephalus. Negative for acute infarct or hemorrhage. No evidence of hypoxic injury. Electronically Signed   By: Franchot Gallo M.D.   On: 04/09/2018 11:41   Mr Brain Wo Contrast  Result Date: 04/10/2018 CLINICAL DATA:  Initial evaluation for cardiac arrest, unresponsive for 40 minutes. Evaluate for anoxic brain injury. EXAM: MRI HEAD WITHOUT CONTRAST TECHNIQUE: Multiplanar, multiecho pulse sequences of the brain and surrounding structures were obtained without intravenous  contrast. COMPARISON:  Prior CT from 04/09/2018. FINDINGS: Brain: There is subtle mildly hyperintense T2/FLAIR hyperintensity involving the ventral medial thalami bilaterally, with extension into the overlying perirolandic cortices. Corresponding mild diffusion abnormality within these regions (series 100, image 30, 42). Inferior extension into the central midbrain with abnormal T2/FLAIR signal in diffusion abnormality within the bilateral cerebellar hemispheres (series 100, image 10). Given provided history, findings suspicious for a [place] and brain injury. Age indeterminate susceptibility artifact within the cerebellar vermis and bilateral cerebellar hemispheres on gradient echo sequence noted, which could reflect associated petechial hemorrhage (series 9, image 7, 10). No evidence for acute vascular infarct. Gray-white matter differentiation otherwise maintained. No other evidence for acute or chronic intracranial hemorrhage. No mass lesion, mass effect, or midline shift. Left frontal approach shunt catheter in place with tip near the posterior septum pellucidum. Left lateral ventricle asymmetrically decompressed as compared to the right. No hydrocephalus or ventricular trapping. No extra-axial collections. Major dural sinuses grossly patent. Pituitary gland within normal limits. Midline structures intact and normal. Vascular: Major intracranial vascular flow voids are maintained. Skull and upper cervical spine: Craniocervical junction within normal limits. Upper cervical spine normal. Bone marrow signal intensity diffusely decreased on T1 weighted imaging, most commonly related to anemia, smoking, obesity, or other chronic disease. Reservoir for shunt catheter present at the left frontal scalp. Sinuses/Orbits: Globes and orbital soft tissues within normal limits. Scattered mucosal thickening within the paranasal sinuses. Fluid seen layering within the nasopharynx. Patient is intubated. Bilateral mastoid effusions  noted. Other: None. IMPRESSION: 1. Subtle T2/FLAIR and diffusion abnormality involving the bilateral perirolandic cortices, thalami, midbrain, and bilateral  cerebellar hemispheres, highly suspicious for anoxic brain injury given the provided history. See above. 2. Left frontal approach shunt catheter in place with stable ventricular size. No hydrocephalus. Electronically Signed   By: Jeannine Boga M.D.   On: 04/10/2018 17:46   Dg Chest Port 1 View  Result Date: 04/11/2018 CLINICAL DATA:  62 year old male with history of pneumonia. EXAM: PORTABLE CHEST 1 VIEW COMPARISON:  Chest x-ray 04/10/2018. FINDINGS: An endotracheal tube is in place with tip 3.5 cm above the carina. There is a right-sided internal jugular central venous catheter with tip terminating in the mid superior vena cava. A nasogastric tube is seen extending into the stomach, however, the tip of the nasogastric tube extends below the lower margin of the image. Lung volumes are slightly low. Persistent bibasilar opacities may reflect areas of atelectasis and/or airspace consolidation. Small bilateral pleural effusions. No definite evidence of pulmonary edema. Heart size is upper limits of normal, accentuated by portable AP technique. IMPRESSION: 1. Support apparatus, as above. 2. Persistent bibasilar opacities (left greater than right), which may reflect areas of atelectasis and/or airspace consolidation, with superimposed small bilateral pleural effusions. Electronically Signed   By: Vinnie Langton M.D.   On: 04/11/2018 07:14   Dg Chest Port 1 View  Result Date: 04/10/2018 CLINICAL DATA:  Pneumonia. EXAM: PORTABLE CHEST 1 VIEW COMPARISON:  Chest x-ray from yesterday. FINDINGS: Unchanged positioning of the endotracheal tube, enteric tube, and right internal jugular central venous catheter. 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 Stable cardiomediastinal silhouette. Hazy opacity in the right mid to lower  lung appears slightly worse when compared to prior study. Unchanged small left pleural effusion and left basilar atelectasis. No pneumothorax. No acute osseous abnormality. IMPRESSION: 1. Slightly worsened hazy opacity in the right mid to lower lung which may reflect a combination of layering pleural fluid and edema/infiltrate. Electronically Signed   By: Titus Dubin M.D.   On: 04/10/2018 07:13     Medications:   . sodium chloride Stopped (04/07/18 2000)  . calcium gluconate 1 g (04/11/18 0820)  . dextrose 5 % and 0.9% NaCl 30 mL/hr at 04/11/18 0800  . DOPamine Stopped (04/08/18 1826)  . famotidine (PEPCID) IV Stopped (04/10/18 2326)  . feeding supplement (VITAL AF 1.2 CAL) 55 mL/hr at 04/11/18 0800  . fentaNYL infusion INTRAVENOUS 100 mcg/hr (04/11/18 0800)  . levETIRAcetam Stopped (04/10/18 2348)  . midazolam (VERSED) infusion 8 mg/hr (04/11/18 0800)  . norepinephrine (LEVOPHED) Adult infusion Stopped (04/10/18 0855)  . phenylephrine (NEO-SYNEPHRINE) Adult infusion Stopped (04/07/18 0745)  . piperacillin-tazobactam (ZOSYN)  IV Stopped (04/11/18 0330)  . vasopressin (PITRESSIN) infusion - *FOR SHOCK* Stopped (04/07/18 7628)   . chlorhexidine gluconate (MEDLINE KIT)  15 mL Mouth Rinse BID  . docusate  100 mg Per Tube BID  . insulin aspart  0-9 Units Subcutaneous Q4H  . mouth rinse  15 mL Mouth Rinse 10 times per day   acetaminophen **OR** acetaminophen, fentaNYL, sodium chloride flush  Assessment/ Plan:  62 y.o. male  with a PMHX of HIV, chronic systolic congestive heart failure, left Vent mural thrombus, anxiety/depression, cryptococcal meningitis history, melanoma, was admitted on 03/27/2018   1.  Acute renal failure,oliguric 2.  Acute respiratory failure 3.  Acute MI, cardiogenic shock 4.  Hyperkalemia  Patient is critically ill requiring ventilatory and hemodynamic support.  He has cardiogenic shock due to acute MI.  Not a candidate for heart catheterization due to unclear  neurological prognosis.  MRI brain 5/4-  anoxic brain injury suspected. Neurology team is following Please let us know if prognosis changes and if care needs to be escalated to start CRRT   LOS: [place] 5/5/20199:10 AM  [place], [place]  Note: This note was prepared with Dragon dictation. Any transcription errors are unintentional

## 2018-04-11 NOTE — Progress Notes (Signed)
Subjective: No improvement. On sedation. Increased myoclonic activity this AM  Objective: Current vital signs: BP 102/60   Pulse (!) 54   Temp 98.8 F (37.1 C) (Oral)   Resp 20   Ht 5' 9"  (1.753 m)   Wt 167 lb 1.7 oz (75.8 kg)   SpO2 96%   BMI 24.68 kg/m  Vital signs in last 24 hours: Temp:  [98 F (36.7 C)-99.9 F (37.7 C)] 98.8 F (37.1 C) (05/05 1200) Pulse Rate:  [49-93] 54 (05/05 1300) Resp:  [11-35] 20 (05/05 1300) BP: (91-134)/(58-85) 102/60 (05/05 1300) SpO2:  [61 %-100 %] 96 % (05/05 1300) Arterial Line BP: (96-189)/(56-97) 107/58 (05/05 1300) FiO2 (%):  [35 %] 35 % (05/05 1300) Weight:  [167 lb 1.7 oz (75.8 kg)] 167 lb 1.7 oz (75.8 kg) (05/05 0421)  Intake/Output from previous day: 05/04 0701 - 05/05 0700 In: 3531.2 [I.V.:1542.9; NG/GT:1503.3; IV Piggyback:485] Out: -  Intake/Output this shift: Total I/O In: 663.2 [I.V.:283.2; NG/GT:330; IV Piggyback:50] Out: -  Nutritional status:  Diet Order    None      Neurologic Exam: Mental Status: Patient does not respond to verbal stimuli. Does not respond to deep sternal rub. Does not follow commands. No verbalizations noted.  Cranial Nerves: II: patient does not respond confrontation bilaterally,both pupils 32m with left unreactive and right reactive. III,IV,VI: doll's response absent bilaterally.  V,VII: corneal reflexabsentbilaterally VIII: patient does not respond to verbal stimuli IX,X: gag reflexreduced, XI: trapezius strength unable to test bilaterally XII: tongue strength unable to test Motor: Extremities flaccid throughout. No spontaneous movement noted. No purposeful movements noted. Sensory: Does not respond to noxious stimuli in any extremity.    Lab Results: Basic Metabolic Panel: Recent Labs  Lab 04/08/18 1650 04/09/18 0018 04/09/18 0459  04/10/18 0304 04/10/18 1419 04/10/18 2007 04/11/18 0255 04/11/18 0509 04/11/18 0821  NA 139 142 141  --  141  --   --   --  143  --    K 6.0* 5.8* 5.6*   < > 5.8* 5.1 5.2* 5.1 4.8 5.0  CL 95* 97* 98*  --  98*  --   --   --  101  --   CO2 30 31 29   --  25  --   --   --  25  --   GLUCOSE 115* 123* 136*  --  132*  --   --   --  150*  --   BUN 65* 71* 75*  --  99*  --   --   --  126*  --   CREATININE 4.83* 5.21* 5.24*  --  6.48*  --   --   --  7.69*  --   CALCIUM 6.3* 6.5* 6.4*  --  6.3*  --   --   --  6.4*  --   MG 2.0 2.1 2.0  --  2.1  --   --   --  2.3  --   PHOS 5.5* 6.8* 6.6*  --  6.1*  --   --   --  5.7*  --    < > = values in this interval not displayed.    Liver Function Tests: Recent Labs  Lab 03/08/2018 2146 04/08/18 0404 04/09/18 0459 04/10/18 0304 04/11/18 0509  AST 972* 2,546* 1,595*  1,602* 1,150* 432*  ALT 944* 1,860* 1,675*  1,654* 1,777* 1,172*  ALKPHOS 81 55 65  66 131* 107  BILITOT 1.4* 1.1 1.4*  1.3* 1.8* 1.0  PROT 6.2*  4.5* 4.6*  4.8* 4.9* 4.1*  ALBUMIN 3.7 2.6* 2.6*  2.7* 2.6* 2.1*   No results for input(s): LIPASE, AMYLASE in the last 168 hours. Recent Labs  Lab 04/10/18 0304  AMMONIA 25    CBC: Recent Labs  Lab 04/04/2018 2146 04/07/18 0353 04/08/18 0404 04/09/18 0459 04/10/18 0304 04/11/18 0509  WBC 15.4* 21.5* 24.1* 20.7* 15.5* 13.6*  NEUTROABS 13.6*  --   --  19.6* 14.5* 12.2*  HGB 14.5 15.3 14.0 13.1 12.6* 11.4*  HCT 44.1 44.9 41.0 37.8* 36.6* 33.1*  MCV 106.7* 106.6* 102.9* 103.8* 103.7* 104.1*  PLT 143* 117* 69* 52* 39* 48*    Cardiac Enzymes: Recent Labs  Lab 03/31/2018 2146 04/07/18 0353 04/07/18 0850 04/07/18 1545 04/08/18 0031  CKTOTAL  --   --  23,390*  --   --   TROPONINI 17.60* >65.00* >65.00* >65.00* >65.00*    Lipid Panel: Recent Labs  Lab 04/05/2018 2146  CHOL 199  TRIG 75  HDL 68  CHOLHDL 2.9  VLDL 15  LDLCALC 116*    CBG: Recent Labs  Lab 04/10/18 2005 04/10/18 2323 04/11/18 0350 04/11/18 0731 04/11/18 1144  GLUCAP 113* 148* 149* 107* 108*    Microbiology: Results for orders placed or performed during the hospital encounter  of 03/10/2018  MRSA PCR Screening     Status: None   Collection Time: 03/16/2018 11:45 PM  Result Value Ref Range Status   MRSA by PCR NEGATIVE NEGATIVE Final    Comment:        The GeneXpert MRSA Assay (FDA approved for NASAL specimens only), is one component of a comprehensive MRSA colonization surveillance program. It is not intended to diagnose MRSA infection nor to guide or monitor treatment for MRSA infections. Performed at [facility], [address], [place], [place] [address]   Urine Culture     Status: None   Collection Time: 03/10/2018 11:45 PM  Result Value Ref Range Status   Specimen Description   Final    URINE, RANDOM Performed at [facility], [address], [place], [place] [address]    Special Requests   Final    NONE Performed at [facility], [address] [place], [place] [address]    Culture   Final    NO GROWTH Performed at [facility], [place] [address], [place], [place] [address]    Report Status 04/08/2018 FINAL  Final  CULTURE, BLOOD (ROUTINE X 2) w Reflex to ID Panel     Status: None (Preliminary result)   Collection Time: 04/07/18  3:54 AM  Result Value Ref Range Status   Specimen Description BLOOD LEFT ANTECUBITAL  Final   Special Requests   Final    BOTTLES DRAWN AEROBIC AND ANAEROBIC Blood Culture adequate volume   Culture   Final    NO GROWTH 4 DAYS Performed at [facility], [address] [place], [place] [address]    Report Status PENDING  Incomplete  CULTURE, BLOOD (ROUTINE X 2) w Reflex to ID Panel     Status: None (Preliminary result)   Collection Time: 04/07/18  4:05 AM  Result Value Ref Range Status   Specimen Description BLOOD LEFT WRIST  Final   Special Requests   Final    BOTTLES DRAWN AEROBIC AND ANAEROBIC Blood Culture adequate volume   Culture   Final    NO GROWTH 4 DAYS Performed at [facility], [address], [place], [place] [address]  Report Status PENDING   Incomplete  Culture, respiratory (NON-Expectorated)     Status: None   Collection Time: 04/07/18  5:36 AM  Result Value Ref Range Status   Specimen Description   Final    TRACHEAL ASPIRATE Performed at [facility], [address], [place], [place] [address]    Special Requests   Final    NONE Performed at [facility], [address] [place], [place] [address]    Gram Stain   Final    FEW WBC PRESENT, PREDOMINANTLY PMN FEW GRAM POSITIVE COCCI RARE GRAM NEGATIVE RODS    Culture   Final    MODERATE STAPHYLOCOCCUS AUREUS WITHIN NORMAL RESPIRATORY FLORA Performed at [facility], [place] [address], [place], [place] [address]    Report Status 04/10/2018 FINAL  Final   Organism ID, Bacteria STAPHYLOCOCCUS AUREUS  Final      Susceptibility   Staphylococcus aureus - MIC*    CIPROFLOXACIN >=8 RESISTANT Resistant     ERYTHROMYCIN <=0.25 SENSITIVE Sensitive     GENTAMICIN <=0.5 SENSITIVE Sensitive     OXACILLIN <=0.25 SENSITIVE Sensitive     TETRACYCLINE <=1 SENSITIVE Sensitive     VANCOMYCIN <=0.5 SENSITIVE Sensitive     TRIMETH/SULFA <=10 SENSITIVE Sensitive     CLINDAMYCIN <=0.25 SENSITIVE Sensitive     RIFAMPIN <=0.5 SENSITIVE Sensitive     Inducible Clindamycin NEGATIVE Sensitive     * MODERATE STAPHYLOCOCCUS AUREUS    Coagulation Studies: No results for input(s): LABPROT, INR in the last 72 hours.  Imaging: Mr Brain Wo Contrast  Result Date: 04/10/2018 CLINICAL DATA:  Initial evaluation for cardiac arrest, unresponsive for 40 minutes. Evaluate for anoxic brain injury. EXAM: MRI HEAD WITHOUT CONTRAST TECHNIQUE: Multiplanar, multiecho pulse sequences of the brain and surrounding structures were obtained without intravenous contrast. COMPARISON:  Prior CT from 04/09/2018. FINDINGS: Brain: There is subtle mildly hyperintense T2/FLAIR hyperintensity involving the ventral medial thalami bilaterally, with extension into the overlying perirolandic cortices.  Corresponding mild diffusion abnormality within these regions (series 100, image 30, 42). Inferior extension into the central midbrain with abnormal T2/FLAIR signal in diffusion abnormality within the bilateral cerebellar hemispheres (series 100, image 10). Given provided history, findings suspicious for a [place] and brain injury. Age indeterminate susceptibility artifact within the cerebellar vermis and bilateral cerebellar hemispheres on gradient echo sequence noted, which could reflect associated petechial hemorrhage (series 9, image 7, 10). No evidence for acute vascular infarct. Gray-white matter differentiation otherwise maintained. No other evidence for acute or chronic intracranial hemorrhage. No mass lesion, mass effect, or midline shift. Left frontal approach shunt catheter in place with tip near the posterior septum pellucidum. Left lateral ventricle asymmetrically decompressed as compared to the right. No hydrocephalus or ventricular trapping. No extra-axial collections. Major dural sinuses grossly patent. Pituitary gland within normal limits. Midline structures intact and normal. Vascular: Major intracranial vascular flow voids are maintained. Skull and upper cervical spine: Craniocervical junction within normal limits. Upper cervical spine normal. Bone marrow signal intensity diffusely decreased on T1 weighted imaging, most commonly related to anemia, smoking, obesity, or other chronic disease. Reservoir for shunt catheter present at the left frontal scalp. Sinuses/Orbits: Globes and orbital soft tissues within normal limits. Scattered mucosal thickening within the paranasal sinuses. Fluid seen layering within the nasopharynx. Patient is intubated. Bilateral mastoid effusions noted. Other: None. IMPRESSION: 1. Subtle T2/FLAIR and diffusion abnormality involving the bilateral perirolandic cortices, thalami, midbrain, and bilateral cerebellar hemispheres, highly suspicious for anoxic brain injury given the  provided history. See above. 2. Left frontal approach shunt catheter in place with stable ventricular size. No hydrocephalus. Electronically Signed   By: Jeannine Boga M.D.   On: 04/10/2018 17:46   Dg Chest Port 1 View  Result Date: 04/11/2018 CLINICAL DATA:  62 year old male with history of pneumonia. EXAM: PORTABLE CHEST 1 VIEW COMPARISON:  Chest x-ray 04/10/2018. FINDINGS: An endotracheal tube is in place with tip 3.5 cm above the carina. There is a right-sided internal jugular central venous catheter with tip terminating in the mid superior vena cava. A nasogastric tube is seen extending into the stomach, however, the tip of the nasogastric tube extends below the lower margin of the image. Lung volumes are slightly low. Persistent bibasilar opacities may reflect areas of atelectasis and/or airspace consolidation. Small bilateral pleural effusions. No definite evidence of pulmonary edema. Heart size is upper limits of normal, accentuated by portable AP technique. IMPRESSION: 1. Support apparatus, as above. 2. Persistent bibasilar opacities (left greater than right), which may reflect areas of atelectasis and/or airspace consolidation, with superimposed small bilateral pleural effusions. Electronically Signed   By: Vinnie Langton M.D.   On: 04/11/2018 07:14   Dg Chest Port 1 View  Result Date: 04/10/2018 CLINICAL DATA:  Pneumonia. EXAM: PORTABLE CHEST 1 VIEW COMPARISON:  Chest x-ray from yesterday. FINDINGS: Unchanged positioning of the endotracheal tube, enteric tube, and right internal jugular central venous catheter. 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 Stable cardiomediastinal silhouette. Hazy opacity in the right mid to lower lung appears slightly worse when compared to prior study. Unchanged small left pleural effusion and left basilar atelectasis. No pneumothorax. No acute osseous abnormality. IMPRESSION: 1. Slightly worsened hazy opacity in the right mid  to lower lung which may reflect a combination of layering pleural fluid and edema/infiltrate. Electronically Signed   By: Titus Dubin M.D.   On: 04/10/2018 07:13    Medications:  I have reviewed the patient's current medications. Scheduled: . chlorhexidine gluconate (MEDLINE KIT)  15 mL Mouth Rinse BID  . docusate  100 mg Per Tube BID  . insulin aspart  0-9 Units Subcutaneous Q4H  . mouth rinse  15 mL Mouth Rinse 10 times per day    Assessment/Plan: Patient remains unresponsive.  On Keppra and Versed for myoclonus.  CTH x 2 no abnormalities found   MRI done last night shows subcortical ischemia consistent with anoxia as well as cerebellum.   S/p discussion with mother over the phone who is very understanding Plan is for withdrawal of care likely tomorrow. She is going to call her 2 other sons.   Pt is already DNR, and will likely have compassionate 1 way extubation tomorrow.

## 2018-04-12 ENCOUNTER — Inpatient Hospital Stay: Payer: Medicare HMO

## 2018-04-12 LAB — CBC WITH DIFFERENTIAL/PLATELET
BASOS ABS: 0 10*3/uL (ref 0–0.1)
BASOS PCT: 0 %
Eosinophils Absolute: 0.2 10*3/uL (ref 0–0.7)
Eosinophils Relative: 2 %
HEMATOCRIT: 33.9 % — AB (ref 40.0–52.0)
HEMOGLOBIN: 11.6 g/dL — AB (ref 13.0–18.0)
Lymphocytes Relative: 8 %
Lymphs Abs: 0.8 10*3/uL — ABNORMAL LOW (ref 1.0–3.6)
MCH: 36.1 pg — ABNORMAL HIGH (ref 26.0–34.0)
MCHC: 34.2 g/dL (ref 32.0–36.0)
MCV: 105.5 fL — ABNORMAL HIGH (ref 80.0–100.0)
Monocytes Absolute: 1.1 10*3/uL — ABNORMAL HIGH (ref 0.2–1.0)
Monocytes Relative: 11 %
NEUTROS ABS: 8.1 10*3/uL — AB (ref 1.4–6.5)
NEUTROS PCT: 79 %
Platelets: 58 10*3/uL — ABNORMAL LOW (ref 150–440)
RBC: 3.21 MIL/uL — ABNORMAL LOW (ref 4.40–5.90)
RDW: 13.6 % (ref 11.5–14.5)
WBC: 10.3 10*3/uL (ref 3.8–10.6)

## 2018-04-12 LAB — CULTURE, BLOOD (ROUTINE X 2)
CULTURE: NO GROWTH
Culture: NO GROWTH
SPECIAL REQUESTS: ADEQUATE
Special Requests: ADEQUATE

## 2018-04-12 LAB — HEPATIC FUNCTION PANEL
ALBUMIN: 2.1 g/dL — AB (ref 3.5–5.0)
ALK PHOS: 99 U/L (ref 38–126)
ALT: 928 U/L — ABNORMAL HIGH (ref 17–63)
AST: 201 U/L — AB (ref 15–41)
BILIRUBIN TOTAL: 0.9 mg/dL (ref 0.3–1.2)
Bilirubin, Direct: 0.1 mg/dL (ref 0.1–0.5)
Indirect Bilirubin: 0.8 mg/dL (ref 0.3–0.9)
Total Protein: 4.4 g/dL — ABNORMAL LOW (ref 6.5–8.1)

## 2018-04-12 LAB — BASIC METABOLIC PANEL
Anion gap: 18 — ABNORMAL HIGH (ref 5–15)
BUN: 140 mg/dL — ABNORMAL HIGH (ref 6–20)
CHLORIDE: 100 mmol/L — AB (ref 101–111)
CO2: 25 mmol/L (ref 22–32)
Calcium: 6.8 mg/dL — ABNORMAL LOW (ref 8.9–10.3)
Creatinine, Ser: 9.27 mg/dL — ABNORMAL HIGH (ref 0.61–1.24)
GFR calc non Af Amer: 5 mL/min — ABNORMAL LOW (ref >60)
GFR, EST AFRICAN AMERICAN: 6 mL/min — AB (ref >60)
Glucose, Bld: 119 mg/dL — ABNORMAL HIGH (ref 65–99)
Potassium: 4.8 mmol/L (ref 3.5–5.1)
SODIUM: 143 mmol/L (ref 135–145)

## 2018-04-12 LAB — POTASSIUM
Potassium: 4.9 mmol/L (ref 3.5–5.1)
Potassium: 5 mmol/L (ref 3.5–5.1)

## 2018-04-12 LAB — GLUCOSE, CAPILLARY
Glucose-Capillary: 113 mg/dL — ABNORMAL HIGH (ref 65–99)
Glucose-Capillary: 85 mg/dL (ref 65–99)
Glucose-Capillary: 95 mg/dL (ref 65–99)

## 2018-04-12 LAB — PHOSPHORUS: PHOSPHORUS: 6.6 mg/dL — AB (ref 2.5–4.6)

## 2018-04-12 LAB — MAGNESIUM: MAGNESIUM: 2.3 mg/dL (ref 1.7–2.4)

## 2018-04-12 MED ORDER — MORPHINE 100MG IN NS 100ML (1MG/ML) PREMIX INFUSION
5.0000 mg/h | INTRAVENOUS | Status: DC
  Filled 2018-04-12: qty 100

## 2018-04-12 MED ORDER — LORAZEPAM 2 MG/ML IJ SOLN
1.0000 mg | INTRAMUSCULAR | Status: DC | PRN
  Filled 2018-04-12: qty 1

## 2018-04-14 ENCOUNTER — Telehealth: Payer: Self-pay | Admitting: *Deleted

## 2018-04-14 ENCOUNTER — Telehealth: Payer: Self-pay

## 2018-04-14 NOTE — Telephone Encounter (Signed)
Death certificate received via fax today from [facility]. Placed in MD folder for completion.

## 2018-04-14 NOTE — Telephone Encounter (Signed)
Charolett Bumpers dropped off death certificate to be completed and signed Place in nurse box

## 2018-04-15 LAB — BLOOD GAS, ARTERIAL
ACID-BASE EXCESS: 4 mmol/L — AB (ref 0.0–2.0)
BICARBONATE: 27.6 mmol/L (ref 20.0–28.0)
FIO2: 40
Mechanical Rate: 20
O2 Saturation: 94.4 %
PEEP: 5 cmH2O
PO2 ART: 67 mmHg — AB (ref 83.0–108.0)
Patient temperature: 37
VT: 500 mL
pCO2 arterial: 37 mmHg (ref 32.0–48.0)
pH, Arterial: 7.48 — ABNORMAL HIGH (ref 7.350–7.450)

## 2018-04-15 NOTE — Telephone Encounter (Signed)
[employer] contacted aware death certificate ready for pick up. Asked if it could be faxed, it was done. Nothing further needed.

## 2018-04-15 NOTE — Telephone Encounter (Signed)
Duplicate certificate. Original brought to office. It has been faxed back and funeral notified.

## 2018-05-08 NOTE — Progress Notes (Signed)
Patient ID: Brian Boyle, male   DOB: 26-Mar-1956, 62 y.o.   MRN: [id number] Pulmonary/critical care attending  Discussed status with patient sounds.  Also agree that he would not want to be like this.  Agreed to proceed with comfort care measures.  They wish to have a chaplain come up and visit.  After that we will proceed with palliative extubation, morphine infusion for pain and as needed Ativan for anxiety/air hunger/comfort  Tora Kindred, DO

## 2018-05-08 NOTE — Progress Notes (Signed)
Chaplain was paged by [facility]. Ch was completing AD for OR . Once completed ch. Looked for Chaplain in ICU 10. Chaplain went to family for exhumation. Chaplain prayed with family for peace and strength. Chaplain  Stayed with family until the pt died and chaplain offered a goodbye prayer and played "Precious Lord" Family left before pronouncement. Chaplain aided by getting information for death form and completed it for the family.     05/06/2018 1500  Clinical Encounter Type  Visited With Patient and family together  Visit Type Patient actively dying  Referral From Nurse  Spiritual Encounters  Spiritual Needs Prayer;Grief support

## 2018-05-08 NOTE — Progress Notes (Signed)
[employer] - [place] [facility]   PATIENT NAME: Brian Boyle    MR#:  [id number]  DATE OF BIRTH:  December 24, 1955  SUBJECTIVE:  Patient seen and evaluated today He is on ventilator for respiratory support Unresposive Overall poor prognosis was discussed with the family by neurology attending Family opted for terminal weaning and comfort care  REVIEW OF SYSTEMS:  Could not be obtained as patient is comatose and on ventilator ROS  DRUG ALLERGIES:  No Known Allergies  VITALS:  Blood pressure 129/77, pulse 85, temperature 98.8 F (37.1 C), temperature source Axillary, resp. rate 15, height  (1.753 m), weight 77.9 kg (171 lb 11.8 oz), SpO2 92 %.  PHYSICAL EXAMINATION:  GENERAL:  62 y.o.-year-old patient lying in the bed on ventilator Ventilator settings Tidal volume 500 Rate 20 PEEP 5 FiO2 35% EYES: Pupils dilated, decreased response to light and accommodation. No scleral icterus. Extraocular muscles intact.  HEENT: Head atraumatic, normocephalic. Oropharynx has endotracheal tube NECK:  Supple, no jugular venous distention. No thyroid enlargement, no tenderness.  LUNGS: Decreased breath sounds bilaterally, scattered rales in both lungs. No use of accessory muscles of respiration.  CARDIOVASCULAR: S1, S2 normal. No murmurs, rubs, or gallops.  ABDOMEN: Soft, nontender, nondistended. Bowel sounds present. No organomegaly or mass.  EXTREMITIES: No pedal edema, cyanosis, or clubbing.  NEUROLOGIC: Comatose Unresponsive Complete exam not possible PSYCHIATRIC: could not be assessed SKIN: No obvious rash, lesion, or ulcer.   Physical Exam LABORATORY PANEL:   CBC Recent Labs  Lab 04-30-18 0419  WBC 10.3  HGB 11.6*  HCT 33.9*  PLT 58*   ------------------------------------------------------------------------------------------------------------------  Chemistries  Recent Labs  Lab 04-30-2018 0419 04-30-18 0802  NA 143  --   K 4.8 5.0  CL 100*  --    CO2 25  --   GLUCOSE 119*  --   BUN 140*  --   CREATININE 9.27*  --   CALCIUM 6.8*  --   MG 2.3  --   AST 201*  --   ALT 928*  --   ALKPHOS 99  --   BILITOT 0.9  --    ------------------------------------------------------------------------------------------------------------------  Cardiac Enzymes Recent Labs  Lab 04/07/18 1545 04/08/18 0031  TROPONINI >65.00* >65.00*   ------------------------------------------------------------------------------------------------------------------  RADIOLOGY:  Mr Brain Wo Contrast  Result Date: 04/10/2018 CLINICAL DATA:  Initial evaluation for cardiac arrest, unresponsive for 40 minutes. Evaluate for anoxic brain injury. EXAM: MRI HEAD WITHOUT CONTRAST TECHNIQUE: Multiplanar, multiecho pulse sequences of the brain and surrounding structures were obtained without intravenous contrast. COMPARISON:  Prior CT from 04/09/2018. FINDINGS: Brain: There is subtle mildly hyperintense T2/FLAIR hyperintensity involving the ventral medial thalami bilaterally, with extension into the overlying perirolandic cortices. Corresponding mild diffusion abnormality within these regions (series 100, image 30, 42). Inferior extension into the central midbrain with abnormal T2/FLAIR signal in diffusion abnormality within the bilateral cerebellar hemispheres (series 100, image 10). Given provided history, findings suspicious for a [place] and brain injury. Age indeterminate susceptibility artifact within the cerebellar vermis and bilateral cerebellar hemispheres on gradient echo sequence noted, which could reflect associated petechial hemorrhage (series 9, image 7, 10). No evidence for acute vascular infarct. Gray-white matter differentiation otherwise maintained. No other evidence for acute or chronic intracranial hemorrhage. No mass lesion, mass effect, or midline shift. Left frontal approach shunt catheter in place with tip near the posterior septum pellucidum. Left lateral  ventricle asymmetrically decompressed as compared to the right. No hydrocephalus or ventricular trapping. No extra-axial collections. Major  dural sinuses grossly patent. Pituitary gland within normal limits. Midline structures intact and normal. Vascular: Major intracranial vascular flow voids are maintained. Skull and upper cervical spine: Craniocervical junction within normal limits. Upper cervical spine normal. Bone marrow signal intensity diffusely decreased on T1 weighted imaging, most commonly related to anemia, smoking, obesity, or other chronic disease. Reservoir for shunt catheter present at the left frontal scalp. Sinuses/Orbits: Globes and orbital soft tissues within normal limits. Scattered mucosal thickening within the paranasal sinuses. Fluid seen layering within the nasopharynx. Patient is intubated. Bilateral mastoid effusions noted. Other: None. IMPRESSION: 1. Subtle T2/FLAIR and diffusion abnormality involving the bilateral perirolandic cortices, thalami, midbrain, and bilateral cerebellar hemispheres, highly suspicious for anoxic brain injury given the provided history. See above. 2. Left frontal approach shunt catheter in place with stable ventricular size. No hydrocephalus. Electronically Signed   By: Rise Mu M.D.   On: 04/10/2018 17:46   Dg Chest Port 1 View  Result Date: 04/25/2018 CLINICAL DATA:  Follow-up pneumonia EXAM: PORTABLE CHEST 1 VIEW COMPARISON:  04/11/2018 FINDINGS: Cardiac shadow is stable. Endotracheal tube, nasogastric catheter and right jugular central line are again seen and stable. Bilateral pleural effusions are seen as well as left retrocardiac consolidation. Improved aeration in the right base is noted. No bony abnormality is seen. IMPRESSION: Bilateral pleural effusions and left lower lobe consolidation. Electronically Signed   By: Alcide Clever M.D.   On: 05/07/2018 07:18   Dg Chest Port 1 View  Result Date: 04/11/2018 CLINICAL DATA:  62 year old male  with history of pneumonia. EXAM: PORTABLE CHEST 1 VIEW COMPARISON:  Chest x-ray 04/10/2018. FINDINGS: An endotracheal tube is in place with tip 3.5 cm above the carina. There is a right-sided internal jugular central venous catheter with tip terminating in the mid superior vena cava. A nasogastric tube is seen extending into the stomach, however, the tip of the nasogastric tube extends below the lower margin of the image. Lung volumes are slightly low. Persistent bibasilar opacities may reflect areas of atelectasis and/or airspace consolidation. Small bilateral pleural effusions. No definite evidence of pulmonary edema. Heart size is upper limits of normal, accentuated by portable AP technique. IMPRESSION: 1. Support apparatus, as above. 2. Persistent bibasilar opacities (left greater than right), which may reflect areas of atelectasis and/or airspace consolidation, with superimposed small bilateral pleural effusions. Electronically Signed   By: Trudie Reed M.D.   On: 04/11/2018 07:14    ASSESSMENT AND PLAN:  *Acute cardiac arrest with cardiogenic shock Prolonged resuscitation effort Continue ventilatory management, vasopressor support with Levophed/Neo-Synephrine/vasopressin with weaning as tolerated, cardiology input appreciated Echocardiogram noted for ejection fraction 25-29%/stage II diastolic dysfunction/moderate pulmonary hypertension  *Acute hypoxic respiratory failure  Secondary to acute cardiac arrest  Continue vent management   *Acute non-STEMI Secondary to acute cardiac arrest Cardiology input appreciated Continue supportive care, vasopressor support per above, echocardiogram noted above  *Acute metabolic acidosis Secondary to above Continue avoid nephrotoxic agents  *Acute renal failure Oliguric Continue to avoid nephrotoxic agents, strict I&O monitoring, daily weights, BMP daily Nephrology follow-up appreciated  *Chronic HIV infection Infectious disease input  appreciated has history of HIV for a long time, history of cryptococcal meningitis in 2014,patient supposed to be on GENYOVA   *Acute encephalopathy Most likely secondary to the above cardiac arrest with respiratory failure Neurology input appreciated CT head negative for any acute process, shunt noted Started on Keppra for myoclonic jerks, EEG is pending  *Acute shock liver  Most likely secondary to acute cardiac arrest/hypotension  stable  Continue to avoid hepatotoxic agents Follow-up liver function tests  *Hyperkalemia improved  *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 Subtle T2/FLAIR and diffusion abnormality involving the bilateral perirolandic cortices, thalami, midbrain, and bilateral cerebellar hemispheres, highly suspicious for anoxic brain injury given the provided history. See above. 2. Left frontal approach shunt catheter in place with stable ventricular size. No hydrocephalus.  *Case was discussed by neurology with the family Patient's family which includes mother do not want any aggressive measures to continue on and any aggressive intervention Plan for terminal weaning today Comfort care measures after terminal weaning  *Condition guarded Long-term prognosis poor Disposition pending clinical course condition guarded   All the records are reviewed and case discussed with Care Management/Social Workerr. Management plans discussed with the patient, family and they are in agreement.  CODE STATUS: DNR  TOTAL TIME TAKING CARE OF THIS PATIENT: 34 minutes.     POSSIBLE D/C IN 2-5 DAYS, DEPENDING ON CLINICAL CONDITION.   Ihor Austin M.D on 2018/05/08   Between 7am to 6pm - Pager - [phone]  After 6pm go to [email or web address] - password [email or web address]  [facility]  Office  [phone]  CC: Primary care physician; Patient, No Pcp Per  Note: This dictation was prepared with Dragon  dictation along with smaller phrase technology. Any transcriptional errors that result from this process are unintentional.

## 2018-05-08 NOTE — Progress Notes (Signed)
Patient ID: Brian Boyle, male   DOB: 1956-08-09, 62 y.o.   MRN: [id number]   Name: Brian Boyle MRN: [id number] DOB: 29-May-1956     CONSULTATION DATE: 03/16/2018  Subjective & Objective: On vent, worsening neuro exam, off pressers and continues to have myoclonus when stimulated.  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  has a past surgical history that includes Ventriculoperitoneal shunt (Left, 07/2013). Prior to Admission medications   Medication Sig Start Date End Date Taking? Authorizing Provider  BIKTARVY 50-200-25 MG TABS tablet Take 1 tablet by mouth daily. 03/19/18  Yes [provider]  cetirizine (ZYRTEC) 5 MG tablet Take 10 mg by mouth daily. 03/04/18  Yes [provider]  ELIQUIS 2.5 MG TABS tablet Take 1 tablet by mouth 2 (two) times daily. 03/22/18  Yes [provider]  fluticasone (FLONASE) 50 MCG/ACT nasal spray Place 1 spray into both nostrils daily. 02/04/18  Yes [provider]  furosemide (LASIX) 40 MG tablet Take 1 tablet by mouth daily. 01/16/18  Yes [provider]  LORazepam (ATIVAN) 0.5 MG tablet Take 2 tablets by mouth daily as needed. 01/14/18  Yes [provider]  losartan (COZAAR) 25 MG tablet Take 12.5 mg by mouth daily. 03/27/18  Yes [provider]  metoprolol succinate (TOPROL-XL) 25 MG 24 hr tablet Take 1 tablet by mouth daily. 01/16/18  Yes [provider]  Multiple Vitamin (MULTIVITAMIN) capsule Take 1 capsule by mouth 4 (four) times daily.   Yes [provider]  polyethylene glycol (MIRALAX / GLYCOLAX) packet Take 17 g by mouth daily as needed.   Yes [provider]  valACYclovir (VALTREX) 1000 MG tablet Take 2 tablets ( ) by mouth at onset of  symptoms and repeat after 12 hours 02/04/18  Yes [provider]   No Known Allergies  FAMILY HISTORY:  family history is not on file. SOCIAL HISTORY:  reports that he has never smoked. He has never used smokeless tobacco. He reports that he drank alcohol.  REVIEW OF SYSTEMS:   Unable to obtain due to critical illness   VITAL SIGNS: Temp:  [97.6 F (36.4 C)-98.8 F (37.1 C)] 97.6 F (36.4 C) (05/06 0800) Pulse Rate:  [46-68] 68 (05/06 0800) Resp:  [16-20] 20 (05/06 0800) BP: (98-126)/(60-80) 126/78 (05/06 0800) SpO2:  [94 %-99 %] 97 % (05/06 0845) Arterial Line BP: (104-128)/(56-73) 127/69 (05/06 0500) FiO2 (%):  [35 %] 35 % (05/06 0845) Weight:  [171 lb 11.8 oz (77.9 kg)] 171 lb 11.8 oz (77.9 kg) (05/06 0420)  Physical Examination:  On versed to stop Mycolonic activity.weakcorneal , No Doll, no gag, no DTR, not triggering the vent and no spontaneous movements. Responds to stimulation by myoclonus. Detailed neuro exam as per neurology. On vent, no distress, BEAE and no rales. S1 & S3 audible and no murmur Benign abdomen with feeble peristalses No leg edema  ASSESSMENT / PLAN: Acute hypoxic respiratory failure. Poor weaning parameters -Monitor ABG,optimize vent settings and continue with vent support.  Anoxic encephalopathy s/p cardiac arrest. Rewarming after hypothermia -On Versed + Keppra forMyclonic seizure -Repeat CT head no evidence of hypoxic injury. MRI suspicious for anoxic encephalopathy. -Case was discussed with neurology, patient is unlikely to have meaningful recovery. The mother was notified by Dr. Loretha Brasil and plan to withdraw support  in am.  Cardiogenic shock post cardiac arrest (VT vs Torsade de pointes) off Dopamin and off Levophed. H/o CHF, LV mural thrombus with MI, non sustained VT -ECHO LV EF 25% with diffuse hypokinesis. -Follow with cardiology.  Metabolic acidosis (improved) with lactic acidemia and sepsis -Off Bicarb. Gtt -Zosyn  for total of 7 days as per ID. -Monitor lactic acid and Procal.  Pneumonia and atelectasis with aspiration. Res CultStaph Aureus MSSA. MRSA PCR -ve. Worsening Rt. L  airspace disease. -Zosyn for total of 7 days as per ID. Monitor CXR + CBC + FIO2 -Follow with ID for ABX recommendation  AnuricAK(owrsening), hyperkalemia, GFR 8 with ATN due to sepsis and renal hypoperfusion -Temporizing agents for hyperkalemia. -Management as per renal, unlikely to have meaningful recovery and not candidate for RRT as discussed with renal. -Optimize hydration, hemodynamics, renal adjustment for meds,monitor renal panel and urine out put.  Elevated LFTs(improved)with acute ischemic hepatitis s/p cardiac arrest. -Optimize hemodynamics, avoid hepatotoxins and monitor LFT.  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 -Follow with ID consult  Anemia -Keep HB > 7 gm/dl.  Thrombocytopenia(worsening) -Monitor Plat.  Hypocalcemia -Replete and monitor electrolytes.  Per report patient comfort care today with palliative withdrawal.  Tora Kindred, D.O.

## 2018-05-08 NOTE — Progress Notes (Signed)
Patient extubated to comfort care at 14:38 and patient passed away at 14:55.

## 2018-05-08 NOTE — Progress Notes (Signed)
[facility], [place] May 07, 2018  Subjective:  Critical illness persist. He remains on the ventilator. Primary team considering withdrawal of care. BUN currently 140 with a creatinine of 9.27 and patient remains essentially anuric.   Objective:  Vital signs in last 24 hours:  Temp:  [97.6 F (36.4 C)-98.8 F (37.1 C)] 98.8 F (37.1 C) (05/06 1130) Pulse Rate:  [46-96] 85 (05/06 1100) Resp:  [11-20] 15 (05/06 1100) BP: (98-129)/(60-83) 129/77 (05/06 1100) SpO2:  [91 %-99 %] 92 % (05/06 1130) Arterial Line BP: (104-128)/(56-73) 127/69 (05/06 0500) FiO2 (%):  [35 %] 35 % (05/06 1130) Weight:  [77.9 kg (171 lb 11.8 oz)] 77.9 kg (171 lb 11.8 oz) (05/06 0420)  Weight change: 2.1 kg (4 lb 10.1 oz) Filed Weights   04/10/18 0406 04/11/18 0421 05-07-18 0420  Weight: 76.6 kg (168 lb 14 oz) 75.8 kg (167 lb 1.7 oz) 77.9 kg (171 lb 11.8 oz)    Intake/Output:    Intake/Output Summary (Last 24 hours) at 07-May-2018 1214 Last data filed at 07-May-2018 1004 Gross per 24 hour  Intake 2312 ml  Output -  Net 2312 ml     Physical Exam: General: Critically ill appearing  HEENT conjunctival edema  Neck Rt IJ CVL  Pulm/lungs Vent assisted, scattered rhonchi  CVS/Heart Regular, no rub  Abdomen:  soft  Extremities: Arm edema b/l  Neurologic: On sedation, not following commands  Skin: Extremities cool to touch          Basic Metabolic Panel:  Recent Labs  Lab 04/09/18 0018 04/09/18 0459  04/10/18 0304  04/11/18 0509  04/11/18 1551 04/11/18 2007 07-May-2018 0318 May 07, 2018 0419 05-07-2018 0802  NA 142 141  --  141  --  143  --   --   --   --  143  --   K 5.8* 5.6*   < > 5.8*   < > 4.8   < > 4.9 4.8 4.9 4.8 5.0  CL 97* 98*  --  98*  --  101  --   --   --   --  100*  --   CO2 31 29  --  25  --  25  --   --   --   --  25  --   GLUCOSE 123* 136*  --  132*  --  150*  --   --   --   --  119*  --   BUN 71* 75*  --  99*  --  126*  --   --   --   --  140*  --    CREATININE 5.21* 5.24*  --  6.48*  --  7.69*  --   --   --   --  9.27*  --   CALCIUM 6.5* 6.4*  --  6.3*  --  6.4*  --   --   --   --  6.8*  --   MG 2.1 2.0  --  2.1  --  2.3  --   --   --   --  2.3  --   PHOS 6.8* 6.6*  --  6.1*  --  5.7*  --   --   --   --  6.6*  --    < > = values in this interval not displayed.     CBC: Recent Labs  Lab 03/08/2018 2146  04/08/18 0404 04/09/18 0459 04/10/18 0304 04/11/18 0509 05-07-18 0419  WBC 15.4*   < >  24.1* 20.7* 15.5* 13.6* 10.3  NEUTROABS 13.6*  --   --  19.6* 14.5* 12.2* 8.1*  HGB 14.5   < > 14.0 13.1 12.6* 11.4* 11.6*  HCT 44.1   < > 41.0 37.8* 36.6* 33.1* 33.9*  MCV 106.7*   < > 102.9* 103.8* 103.7* 104.1* 105.5*  PLT 143*   < > 69* 52* 39* 48* 58*   < > = values in this interval not displayed.     No results found for: HEPBSAG, HEPBSAB, HEPBIGM    Microbiology:  Recent Results (from the past 240 hour(s))  MRSA PCR Screening     Status: None   Collection Time: 03/17/2018 11:45 PM  Result Value Ref Range Status   MRSA by PCR NEGATIVE NEGATIVE Final    Comment:        The GeneXpert MRSA Assay (FDA approved for NASAL specimens only), is one component of a comprehensive MRSA colonization surveillance program. It is not intended to diagnose MRSA infection nor to guide or monitor treatment for MRSA infections. Performed at [facility], [address], [place], [place] [address]   Urine Culture     Status: None   Collection Time: 03/27/2018 11:45 PM  Result Value Ref Range Status   Specimen Description   Final    URINE, RANDOM Performed at [facility], [address] [place], [place] [address]    Special Requests   Final    NONE Performed at [facility], [address], [place], [place] [address]    Culture   Final    NO GROWTH Performed at [facility], [place] [address], [place], [place] [address]    Report Status 04/08/2018 FINAL  Final  CULTURE, BLOOD (ROUTINE X 2) w Reflex to  ID Panel     Status: None   Collection Time: 04/07/18  3:54 AM  Result Value Ref Range Status   Specimen Description BLOOD LEFT ANTECUBITAL  Final   Special Requests   Final    BOTTLES DRAWN AEROBIC AND ANAEROBIC Blood Culture adequate volume   Culture   Final    NO GROWTH 5 DAYS Performed at [facility], [address] [place], [place] [address]    Report Status 04/22/2018 FINAL  Final  CULTURE, BLOOD (ROUTINE X 2) w Reflex to ID Panel     Status: None   Collection Time: 04/07/18  4:05 AM  Result Value Ref Range Status   Specimen Description BLOOD LEFT WRIST  Final   Special Requests   Final    BOTTLES DRAWN AEROBIC AND ANAEROBIC Blood Culture adequate volume   Culture   Final    NO GROWTH 5 DAYS Performed at [facility], [address], [place], [place] [address]    Report Status 04-22-2018 FINAL  Final  Culture, respiratory (NON-Expectorated)     Status: None   Collection Time: 04/07/18  5:36 AM  Result Value Ref Range Status   Specimen Description   Final    TRACHEAL ASPIRATE Performed at [facility], [address] [place], [place] [address]    Special Requests   Final    NONE Performed at [facility], [address] [place], [place] [address]    Gram Stain   Final    FEW WBC PRESENT, PREDOMINANTLY PMN FEW GRAM POSITIVE COCCI RARE GRAM NEGATIVE RODS    Culture   Final    MODERATE STAPHYLOCOCCUS AUREUS WITHIN NORMAL RESPIRATORY FLORA Performed at [facility], [place] [address] [place], [place] [address]    Report Status 04/10/2018 FINAL  Final   Organism ID, Bacteria STAPHYLOCOCCUS AUREUS  Final      Susceptibility   Staphylococcus aureus - MIC*    CIPROFLOXACIN >=8 RESISTANT Resistant     ERYTHROMYCIN <=0.25 SENSITIVE Sensitive     GENTAMICIN <=0.5 SENSITIVE Sensitive     OXACILLIN <=0.25 SENSITIVE Sensitive     TETRACYCLINE <=1 SENSITIVE Sensitive     VANCOMYCIN <=0.5 SENSITIVE Sensitive     TRIMETH/SULFA  <=10 SENSITIVE Sensitive     CLINDAMYCIN <=0.25 SENSITIVE Sensitive     RIFAMPIN <=0.5 SENSITIVE Sensitive     Inducible Clindamycin NEGATIVE Sensitive     * MODERATE STAPHYLOCOCCUS AUREUS    Coagulation Studies: No results for input(s): LABPROT, INR in the last 72 hours.  Urinalysis: No results for input(s): COLORURINE, LABSPEC, PHURINE, GLUCOSEU, HGBUR, BILIRUBINUR, KETONESUR, PROTEINUR, UROBILINOGEN, NITRITE, LEUKOCYTESUR in the last 72 hours.  Invalid input(s): APPERANCEUR    Imaging: Mr Brain Wo Contrast  Result Date: 04/10/2018 CLINICAL DATA:  Initial evaluation for cardiac arrest, unresponsive for 40 minutes. Evaluate for anoxic brain injury. EXAM: MRI HEAD WITHOUT CONTRAST TECHNIQUE: Multiplanar, multiecho pulse sequences of the brain and surrounding structures were obtained without intravenous contrast. COMPARISON:  Prior CT from 04/09/2018. FINDINGS: Brain: There is subtle mildly hyperintense T2/FLAIR hyperintensity involving the ventral medial thalami bilaterally, with extension into the overlying perirolandic cortices. Corresponding mild diffusion abnormality within these regions (series 100, image 30, 42). Inferior extension into the central midbrain with abnormal T2/FLAIR signal in diffusion abnormality within the bilateral cerebellar hemispheres (series 100, image 10). Given provided history, findings suspicious for a [place] and brain injury. Age indeterminate susceptibility artifact within the cerebellar vermis and bilateral cerebellar hemispheres on gradient echo sequence noted, which could reflect associated petechial hemorrhage (series 9, image 7, 10). No evidence for acute vascular infarct. Gray-white matter differentiation otherwise maintained. No other evidence for acute or chronic intracranial hemorrhage. No mass lesion, mass effect, or midline shift. Left frontal approach shunt catheter in place with tip near the posterior septum pellucidum. Left lateral ventricle  asymmetrically decompressed as compared to the right. No hydrocephalus or ventricular trapping. No extra-axial collections. Major dural sinuses grossly patent. Pituitary gland within normal limits. Midline structures intact and normal. Vascular: Major intracranial vascular flow voids are maintained. Skull and upper cervical spine: Craniocervical junction within normal limits. Upper cervical spine normal. Bone marrow signal intensity diffusely decreased on T1 weighted imaging, most commonly related to anemia, smoking, obesity, or other chronic disease. Reservoir for shunt catheter present at the left frontal scalp. Sinuses/Orbits: Globes and orbital soft tissues within normal limits. Scattered mucosal thickening within the paranasal sinuses. Fluid seen layering within the nasopharynx. Patient is intubated. Bilateral mastoid effusions noted. Other: None. IMPRESSION: 1. Subtle T2/FLAIR and diffusion abnormality involving the bilateral perirolandic cortices, thalami, midbrain, and bilateral cerebellar hemispheres, highly suspicious for anoxic brain injury given the provided history. See above. 2. Left frontal approach shunt catheter in place with stable ventricular size. No hydrocephalus. Electronically Signed   By: Jeannine Boga M.D.   On: 04/10/2018 17:46   Dg Chest Port 1 View  Result Date: 2018/05/11 CLINICAL DATA:  Follow-up pneumonia EXAM: PORTABLE CHEST 1 VIEW COMPARISON:  04/11/2018 FINDINGS: Cardiac shadow is stable. Endotracheal tube, nasogastric catheter and right jugular central line are again seen and stable. Bilateral pleural effusions are seen as well as left retrocardiac consolidation. Improved aeration in the right base is noted.  No bony abnormality is seen. IMPRESSION: Bilateral pleural effusions and left lower lobe consolidation. Electronically Signed   By: Inez Catalina M.D.   On: May 11, 2018 07:18   Dg Chest Port 1 View  Result Date: 04/11/2018 CLINICAL DATA:  62 year old male with  history of pneumonia. EXAM: PORTABLE CHEST 1 VIEW COMPARISON:  Chest x-ray 04/10/2018. FINDINGS: An endotracheal tube is in place with tip 3.5 cm above the carina. There is a right-sided internal jugular central venous catheter with tip terminating in the mid superior vena cava. A nasogastric tube is seen extending into the stomach, however, the tip of the nasogastric tube extends below the lower margin of the image. Lung volumes are slightly low. Persistent bibasilar opacities may reflect areas of atelectasis and/or airspace consolidation. Small bilateral pleural effusions. No definite evidence of pulmonary edema. Heart size is upper limits of normal, accentuated by portable AP technique. IMPRESSION: 1. Support apparatus, as above. 2. Persistent bibasilar opacities (left greater than right), which may reflect areas of atelectasis and/or airspace consolidation, with superimposed small bilateral pleural effusions. Electronically Signed   By: Vinnie Langton M.D.   On: 04/11/2018 07:14     Medications:   . sodium chloride Stopped (04/07/18 2000)  . DOPamine Stopped (04/08/18 1826)  . feeding supplement (VITAL AF 1.2 CAL) 1,000 mL (05/11/2018 0706)  . fentaNYL infusion INTRAVENOUS 200 mcg/hr (05-11-2018 1045)  . midazolam (VERSED) infusion 8 mg/hr (2018/05/11 1147)  . norepinephrine (LEVOPHED) Adult infusion Stopped (04/10/18 0855)  . phenylephrine (NEO-SYNEPHRINE) Adult infusion Stopped (04/07/18 0745)  . vasopressin (PITRESSIN) infusion - *FOR SHOCK* Stopped (04/07/18 1308)   . chlorhexidine gluconate (MEDLINE KIT)  15 mL Mouth Rinse BID  . mouth rinse  15 mL Mouth Rinse 10 times per day   acetaminophen **OR** acetaminophen, fentaNYL, sodium chloride flush  Assessment/ Plan:  62 y.o. male  with a PMHX of HIV, chronic systolic congestive heart failure, left Vent mural thrombus, anxiety/depression, cryptococcal meningitis history, melanoma, was admitted on 03/22/2018   1.  Acute renal failure,  oliguric 2.  Acute respiratory failure 3.  Acute MI, cardiogenic shock 4.  Hyperkalemia  Plan:  Patient remains critically ill at this point in time.  He remains essentially anuric with a BUN of 140 and creatinine of 9.27.  He remains on full ventilatory support.  Primary team considering withdrawal of care.  Therefore no plans for dialysis at the moment.  This was discussed with Dr. Jefferson Fuel who will contact us if renal placement therapy as needed.    LOS: 6 Markasia Carrol 06-04-201912:14 PM  [place], [place]  Note: This note was prepared with Dragon dictation. Any transcription errors are unintentional

## 2018-05-08 NOTE — Progress Notes (Signed)
Nutrition Follow-up  DOCUMENTATION CODES:   Non-severe (moderate) malnutrition in context of chronic illness  INTERVENTION:  Continue Vital AF 1.2 at 55 mL/hr (1320 mL goal daily volume) via OGT. Provides 1584 kcal, 99 grams of protein, 1069 mL H2O daily.  Continue minimal free water flush of 30 mL Q4hrs to maintain tube patency.  Once patient transitions to comfort care recommend discontinuing tube feeds.  NUTRITION DIAGNOSIS:   Moderate Malnutrition related to chronic illness(AIDS, CHF) as evidenced by moderate fat depletion, moderate muscle depletion.  Ongoing - addressing with TF regimen.  GOAL:   Provide needs based on ASPEN/SCCM guidelines  Met with TF regimen.  MONITOR:   Vent status, Labs, I & O's, Weight trends  REASON FOR ASSESSMENT:   Ventilator    ASSESSMENT:   62 y.o. male with a known history of HIV & AIDS, chronic systolic CHF, nonsustained ventricular tachycardia, cardiomyopathy, left ventricular mural thrombus, anxiety/depression disorder, cryptococcal meningoencephalitis and melanoma. He was admitted for cardiac arrest and is on the 36 degrees C temperature management protocol.   -Rectal tube placed on 5/4. -MRI brain on 5/4 suspicious for anoxic brain injury. -Per Neurology patient is not likely to have meaningful recovery.  Patient intubated and sedated. Patient is off all pressors. Mother at bedside. Abdomen feels slightly distended but not taut. Trickle tube feeds were initiated on 5/2 and then patient was advanced to his goal rate on 5/3.  Access: OGT placed 4/30; terminates in distal stomach per abdominal x-ray 5/1; 70 cm at corner of mouth  MAP: 77-97 mmHg  TF: pt tolerating Vital AF 1.2 at 55 mL/hr with free water flush of 30 mL Q4hrs; per pump history patient received 1300 mL tube feeds in the past 24 hrs (98.5% goal daily volume)  Patient is currently intubated on ventilator support Ve: 7.8 L/min Temp (24hrs), Avg:98.1 F (36.7 C),  Min:97.6 F (36.4 C), Max:98.8 F (37.1 C)  Propofol: N/A  Medications reviewed and include: fentanyl gtt, Versed gtt.  Labs reviewed: CBG 85-95, Chloride 100, BUN 140, Creatinine 9.27, Phosphorus 6.6.  I/O: no UOP documented yesterday  Weight trend: 77.9 kg on 5/6; +17.9 kg from admission  Discussed with RN and on rounds. Patient continues to have myoclonus when stimulated. Plan is for transition to comfort care today and terminal extubation.  Diet Order:   Diet Order    None      EDUCATION NEEDS:   Not appropriate for education at this time  Skin:  Skin Assessment: Reviewed RN Assessment Skin Integrity Issues:: Other (Comment) Other: mottled skin on bilateral feet  Last BM:  2018/05/03 - small type 7 in rectal tube  Height:   Ht Readings from Last 1 Encounters:  04/07/18 5' 9"  (1.753 m)    Weight:   Wt Readings from Last 1 Encounters:  2018-05-03 171 lb 11.8 oz (77.9 kg)    Ideal Body Weight:  72.7 kg  BMI:  Body mass index is 25.36 kg/m.  Estimated Nutritional Needs:   Kcal:  1556 kcal (PSU)  Protein:  97-110 gm/day (ABW x 1.5-1.7)  Fluid:  >1.9 L/day (30 ml/kg) or per MD  Willey Blade, MS, RD, LDN Office: [phone] Pager: [phone] After Hours/Weekend Pager: [phone]

## 2018-05-08 NOTE — Progress Notes (Signed)
SUBJECTIVE: Remains intubated   Vitals:   04/26/2018 0500 04/14/2018 0600 05/07/2018 0700 04/30/2018 0800  BP: 111/75 101/80 117/78 126/78  Pulse: (!) 48 (!) 59 64 68  Resp: Temp:    97.6 F (36.4 C)  TempSrc:    Axillary  SpO2: 96% 94% 98% 98%  Weight:      Height:        Intake/Output Summary (Last 24 hours) at 04/30/2018 0845 Last data filed at 04/20/2018 0700 Gross per 24 hour  Intake 2724 ml  Output -  Net 2724 ml    LABS: Basic Metabolic Panel: Recent Labs    04/11/18 0509  05/03/2018 0419 04/29/2018 0802  NA 143  --  143  --   K 4.8   < > 4.8 5.0  CL 101  --  100*  --   CO2 25  --  25  --   GLUCOSE 150*  --  119*  --   BUN 126*  --  140*  --   CREATININE 7.69*  --  9.27*  --   CALCIUM 6.4*  --  6.8*  --   MG 2.3  --  2.3  --   PHOS 5.7*  --  6.6*  --    < > = values in this interval not displayed.   Liver Function Tests: Recent Labs    04/11/18 0509 04/14/2018 0419  AST 432* 201*  ALT 1,172* 928*  ALKPHOS 107 99  BILITOT 1.0 0.9  PROT 4.1* 4.4*  ALBUMIN 2.1* 2.1*   No results for input(s): LIPASE, AMYLASE in the last 72 hours. CBC: Recent Labs    04/11/18 0509 04/09/2018 0419  WBC 13.6* 10.3  NEUTROABS 12.2* 8.1*  HGB 11.4* 11.6*  HCT 33.1* 33.9*  MCV 104.1* 105.5*  PLT 48* 58*   Cardiac Enzymes: No results for input(s): CKTOTAL, CKMB, CKMBINDEX, TROPONINI in the last 72 hours. BNP: Invalid input(s): POCBNP D-Dimer: No results for input(s): DDIMER in the last 72 hours. Hemoglobin A1C: No results for input(s): HGBA1C in the last 72 hours. Fasting Lipid Panel: No results for input(s): CHOL, HDL, LDLCALC, TRIG, CHOLHDL, LDLDIRECT in the last 72 hours. Thyroid Function Tests: No results for input(s): TSH, T4TOTAL, T3FREE, THYROIDAB in the last 72 hours.  Invalid input(s): FREET3 Anemia Panel: No results for input(s): VITAMINB12, FOLATE, FERRITIN, TIBC, IRON, RETICCTPCT in the last 72 hours.   PHYSICAL EXAM General: Well developed,  well nourished, in no acute distress HEENT:  Normocephalic and atramatic Neck:  No JVD.  Lungs: Clear bilaterally to auscultation and percussion. Heart: HRRR . Normal S1 and S2 without gallops or murmurs.  Abdomen: Bowel sounds are positive, abdomen soft and non-tender  Msk:  Back normal, normal gait. Normal strength and tone for age. Extremities: No clubbing, cyanosis or edema.   Neuro: Alert and oriented X 3. Psych:  Good affect, responds appropriately  TELEMETRY: NSR  ASSESSMENT AND PLAN: S/p Acute MI, and resp. Failure and cardiac arrest, now DNR, and continue supportive care.  Active Problems:   Cardiac arrest (HCC)   Myoclonus   Malnutrition of moderate degree    KHAN,SHAUKAT A, MD, [facility] 05/02/2018 8:45 AM

## 2018-05-08 NOTE — Progress Notes (Signed)
Patient is extubated now

## 2018-05-08 NOTE — Progress Notes (Signed)
Patient has had no changes during this shift. Plan of care for today is withdrawal of care per mother's request.

## 2018-05-08 NOTE — Death Summary Note (Signed)
DEATH SUMMARY   Patient Details  Name: Brian Boyle MRN: [id number] DOB: 1956/08/20  Admission/Discharge Information   Admit Date:  2018-04-10  Date of Death: Date of Death: 04/16/18  Time of Death: Time of Death: 1455  Length of Stay: 6  Referring Physician: Patient, No Pcp Per   Reason(s) for Hospitalization  S/P Cardiac Arrest  Diagnoses  Preliminary cause of death:  Secondary Diagnoses (including complications and co-morbidities):  Active Problems:   Cardiac arrest (HCC)   Myoclonus   Malnutrition of moderate degree   Brief Hospital Course (including significant findings, care, treatment, and services provided and events leading to death)  Kay Ricciuti  is a 62 y.o. male with a known history of HIV, chronic systolic CHF, nonsustained ventricular tachycardia, cardiomyopathy, left ventricular mural thrombus, anxiety/depression disorder, cryptococcal meningoencephalitis and melanoma. He was brought to emergency room via EMS, status post witnessed cardiac arrest.  Apparently, patient collapsed at home, after he returned from the gym, earlier in the day.  His mother called 911 right away.  Per paramedics report, patient was unresponsive, and cardiac arrest and initial cardiac rhythm showed ventricular tachycardia versus torsades.  He was coded for approximately 40 minutes.  He was defibrillated 11 times.  And was intubated and started on pressor support and brought to emergency room.   He was admitted to the intensive care unit, started on targeted temperature management.  He was seen by infectious disease, cardiology, nephrology, neurology and critical care team.  He was felt not to be a cardiac catheterization candidate on admission, he was in acute renal failure most likely secondary to hypoperfusion,He was started on Keppra, was noted to have myoclonic jerks, was started on a Versed infusion.  Patient remained unresponsive, initial CT scan and follow-up did not show any significant  abnormalities, MRI of the brain was performed which revealed significant anoxic brain injury.  Please see neurology notes and MRI report.  Discussions with family per neurology and critical care team.  Patient's family states that he would not want this to continue.  Patient is a DNR and wished change to comfort care management.  I confirm this with family members today, proceeded with palliative extubation, morphine for pain and Ativan for anxiety and air hunger.  Patient deceased at 48      Pertinent Labs and Studies  Significant Diagnostic Studies Dg Chest 1 View  Result Date: 04/07/2018 CLINICAL DATA:  Endotracheal tube placement EXAM: CHEST  1 VIEW COMPARISON:  2018/04/10 FINDINGS: Endotracheal tube has been placed with tip measuring 3.4 cm above the carina. Right central venous catheter with tip over the mid SVC region. No pneumothorax. Enteric tube tip is off the field of view but below the left hemidiaphragm. Tubing over the left chest consistent with ventricular peritoneal shunt tubing. Cardiac enlargement with mild pulmonary vascular congestion. No consolidation or edema. No blunting of costophrenic angles. Mediastinal contours appear intact. IMPRESSION: Appliances appear in satisfactory position. Cardiac enlargement. Mild pulmonary vascular congestion. No edema or consolidation. Electronically Signed   By: Burman Nieves M.D.   On: 04/07/2018 00:31   Dg Abd 1 View  Result Date: 04/07/2018 CLINICAL DATA:  OG tube placement EXAM: ABDOMEN - 1 VIEW COMPARISON:  2018/04/10 FINDINGS: Enteric tube tip is in the left mid abdomen consistent with location in the distal stomach. Visualized colon is not abnormally distended. Ventricular peritoneal shunt tubing is present. IMPRESSION: Enteric tube tip is in the left mid abdomen consistent with location in the distal stomach. Electronically Signed  By: Burman Nieves M.D.   On: 04/07/2018 00:32   Ct Head Wo Contrast  Result Date:  04/09/2018 CLINICAL DATA:  Cardiac arrest 3 days ago.  HIV EXAM: CT HEAD WITHOUT CONTRAST TECHNIQUE: Contiguous axial images were obtained from the base of the skull through the vertex without intravenous contrast. COMPARISON:  CT head 05-05-18 FINDINGS: Brain: Left frontal ventricular shunt catheter unchanged in position in the midline posterior to the lateral ventricles. Slit-like left lateral ventricle unchanged. Right lateral ventricle not enlarged and unchanged. Hypodensity left frontal lobe at the site of catheter insertion is unchanged compatible with chronic encephalomalacia. No acute infarct, hemorrhage, or mass. Vascular: Negative for hyperdense vessel Skull: Negative Sinuses/Orbits: Mild mucosal edema paranasal sinuses.  Normal orbit Other: None IMPRESSION: Left frontal shunt catheter unchanged.  Negative for hydrocephalus. Negative for acute infarct or hemorrhage. No evidence of hypoxic injury. Electronically Signed   By: Marlan Palau M.D.   On: 04/09/2018 11:41   Ct Head Wo Contrast  Result Date: 05-05-18 CLINICAL DATA:  Altered LOC, cardiac arrest EXAM: CT HEAD WITHOUT CONTRAST TECHNIQUE: Contiguous axial images were obtained from the base of the skull through the vertex without intravenous contrast. COMPARISON:  None. FINDINGS: Brain: No acute territorial infarction or hemorrhage is visualized. Mild encephalomalacia in the left frontal lobe adjacent to ventricular catheter. Left frontal catheter terminates at the midline posteriorly, just above the pineal region. Slight asymmetric appearance of the ventricles, right larger than left but no ventricular enlargement. Possible small focus of encephalomalacia in the left posterior temporal lobe. Vascular: No hyperdense vessels. Scattered calcifications at the carotid siphons. Skull: No fracture Sinuses/Orbits: Mild mucosal thickening in the ethmoid sinuses. No acute orbital abnormality Other: None IMPRESSION: 1. No definite CT evidence for acute  intracranial abnormality. 2. Left frontal ventricular catheter with adjacent encephalomalacia in the frontal lobe. Slight asymmetric appearance of ventricles, right larger than left but without hydrocephalus. Electronically Signed   By: Jasmine Pang M.D.   On: 05/05/18 23:53   Mr Brain Wo Contrast  Result Date: 04/10/2018 CLINICAL DATA:  Initial evaluation for cardiac arrest, unresponsive for 40 minutes. Evaluate for anoxic brain injury. EXAM: MRI HEAD WITHOUT CONTRAST TECHNIQUE: Multiplanar, multiecho pulse sequences of the brain and surrounding structures were obtained without intravenous contrast. COMPARISON:  Prior CT from 04/09/2018. FINDINGS: Brain: There is subtle mildly hyperintense T2/FLAIR hyperintensity involving the ventral medial thalami bilaterally, with extension into the overlying perirolandic cortices. Corresponding mild diffusion abnormality within these regions (series 100, image 30, 42). Inferior extension into the central midbrain with abnormal T2/FLAIR signal in diffusion abnormality within the bilateral cerebellar hemispheres (series 100, image 10). Given provided history, findings suspicious for a [place] and brain injury. Age indeterminate susceptibility artifact within the cerebellar vermis and bilateral cerebellar hemispheres on gradient echo sequence noted, which could reflect associated petechial hemorrhage (series 9, image 7, 10). No evidence for acute vascular infarct. Gray-white matter differentiation otherwise maintained. No other evidence for acute or chronic intracranial hemorrhage. No mass lesion, mass effect, or midline shift. Left frontal approach shunt catheter in place with tip near the posterior septum pellucidum. Left lateral ventricle asymmetrically decompressed as compared to the right. No hydrocephalus or ventricular trapping. No extra-axial collections. Major dural sinuses grossly patent. Pituitary gland within normal limits. Midline structures intact and normal.  Vascular: Major intracranial vascular flow voids are maintained. Skull and upper cervical spine: Craniocervical junction within normal limits. Upper cervical spine normal. Bone marrow signal intensity diffusely decreased on T1 weighted imaging, most  commonly related to anemia, smoking, obesity, or other chronic disease. Reservoir for shunt catheter present at the left frontal scalp. Sinuses/Orbits: Globes and orbital soft tissues within normal limits. Scattered mucosal thickening within the paranasal sinuses. Fluid seen layering within the nasopharynx. Patient is intubated. Bilateral mastoid effusions noted. Other: None. IMPRESSION: 1. Subtle T2/FLAIR and diffusion abnormality involving the bilateral perirolandic cortices, thalami, midbrain, and bilateral cerebellar hemispheres, highly suspicious for anoxic brain injury given the provided history. See above. 2. Left frontal approach shunt catheter in place with stable ventricular size. No hydrocephalus. Electronically Signed   By: Rise Mu M.D.   On: 04/10/2018 17:46   Dg Chest Port 1 View  Result Date: Apr 27, 2018 CLINICAL DATA:  Follow-up pneumonia EXAM: PORTABLE CHEST 1 VIEW COMPARISON:  04/11/2018 FINDINGS: Cardiac shadow is stable. Endotracheal tube, nasogastric catheter and right jugular central line are again seen and stable. Bilateral pleural effusions are seen as well as left retrocardiac consolidation. Improved aeration in the right base is noted. No bony abnormality is seen. IMPRESSION: Bilateral pleural effusions and left lower lobe consolidation. Electronically Signed   By: Alcide Clever M.D.   On: 04-27-2018 07:18   Dg Chest Port 1 View  Result Date: 04/11/2018 CLINICAL DATA:  62 year old male with history of pneumonia. EXAM: PORTABLE CHEST 1 VIEW COMPARISON:  Chest x-ray 04/10/2018. FINDINGS: An endotracheal tube is in place with tip 3.5 cm above the carina. There is a right-sided internal jugular central venous catheter with tip  terminating in the mid superior vena cava. A nasogastric tube is seen extending into the stomach, however, the tip of the nasogastric tube extends below the lower margin of the image. Lung volumes are slightly low. Persistent bibasilar opacities may reflect areas of atelectasis and/or airspace consolidation. Small bilateral pleural effusions. No definite evidence of pulmonary edema. Heart size is upper limits of normal, accentuated by portable AP technique. IMPRESSION: 1. Support apparatus, as above. 2. Persistent bibasilar opacities (left greater than right), which may reflect areas of atelectasis and/or airspace consolidation, with superimposed small bilateral pleural effusions. Electronically Signed   By: Trudie Reed M.D.   On: 04/11/2018 07:14   Dg Chest Port 1 View  Result Date: 04/10/2018 CLINICAL DATA:  Pneumonia. EXAM: PORTABLE CHEST 1 VIEW COMPARISON:  Chest x-ray from yesterday. FINDINGS: Unchanged positioning of the endotracheal tube, enteric tube, and right internal jugular central venous catheter. 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 Stable cardiomediastinal silhouette. Hazy opacity in the right mid to lower lung appears slightly worse when compared to prior study. Unchanged small left pleural effusion and left basilar atelectasis. No pneumothorax. No acute osseous abnormality. IMPRESSION: 1. Slightly worsened hazy opacity in the right mid to lower lung which may reflect a combination of layering pleural fluid and edema/infiltrate. Electronically Signed   By: Obie Dredge M.D.   On: 04/10/2018 07:13   Dg Chest Port 1 View  Result Date: 04/09/2018 CLINICAL DATA:  Pneumonia. EXAM: PORTABLE CHEST 1 VIEW COMPARISON:  Radiograph of Apr 08, 2018. FINDINGS: Stable cardiomegaly. Endotracheal and nasogastric tubes are unchanged in position. Right internal jugular catheter is unchanged in position. Left pleural effusion appears to be smaller compared to prior exam  with associated atelectasis. Stable right lower lobe opacity is noted concerning for edema or pneumonia. Bony thorax is unremarkable. IMPRESSION: Stable support apparatus. Stable right basilar opacity. Improved left pleural effusion with associated atelectasis. Electronically Signed   By: Lupita Raider, M.D.   On: 04/09/2018 07:12  Dg Chest Port 1 View  Result Date: 04/08/2018 CLINICAL DATA:  CHF, intubated patient, malnutrition, HIV. EXAM: PORTABLE CHEST 1 VIEW COMPARISON:  Portable chest x-ray of 04-17-18 FINDINGS: The lungs are mildly hyperinflated. The perihilar lung markings are increased and more conspicuous today. External pacemaker defibrillator pads are present. The heart is top normal in size. The pulmonary vascularity is engorged and indistinct. The endotracheal tube tip projects approximately 3 cm above the carina. The esophagogastric tube tip and proximal port project below the GE junction. The right internal jugular venous catheter tip projects over the proximal SVC. A ventriculoperitoneal shunt tube is visible to the left of midline. IMPRESSION: CHF with interstitial edema much more conspicuous than on yesterday's study. Probable posterior layering of pleural effusions. The support tubes and devices are in reasonable position. Electronically Signed   By: David  [place] M.D.   On: 04/08/2018 09:07   Dg Chest Portable 1 View  Result Date: 2018-04-17 CLINICAL DATA:  Post code EXAM: PORTABLE CHEST 1 VIEW COMPARISON:  None. FINDINGS: Endotracheal tube tip is about 6.1 cm superior to carina. Esophageal tube tip projects over the distal esophagus. Right IJ central venous catheter tip over the SVC. No pneumothorax. No focal opacity or pleural effusion. Mild cardiomegaly. Probable left-sided shunt catheter. IMPRESSION: 1. Endotracheal tube tip about 6.1 cm superior to carina 2. Esophageal tube tip overlies the distal esophagus, further advancement recommended for more optimal positioning. 3.  Mild cardiomegaly without acute infiltrate 4. Right IJ central venous catheter tip over the SVC without pneumothorax. Electronically Signed   By: Jasmine Pang M.D.   On: 04/17/18 22:23   Dg Abd Portable 1 View  Result Date: 2018-04-17 CLINICAL DATA:  OG tube placement EXAM: PORTABLE ABDOMEN - 1 VIEW COMPARISON:  None. FINDINGS: The tip of the esophageal tube may be visible near the distal esophagus. Mild gaseous enlargement of stomach. Left-sided shunt tubing courses toward the pelvis but is incompletely imaged. Nonobstructed gas pattern. IMPRESSION: Esophageal tube tip appears to overlie the distal esophagus. Further advancement recommended for more optimal positioning Electronically Signed   By: Jasmine Pang M.D.   On: 04/17/2018 22:24    Microbiology Recent Results (from the past 240 hour(s))  MRSA PCR Screening     Status: None   Collection Time: 04-17-18 11:45 PM  Result Value Ref Range Status   MRSA by PCR NEGATIVE NEGATIVE Final    Comment:        The GeneXpert MRSA Assay (FDA approved for NASAL specimens only), is one component of a comprehensive MRSA colonization surveillance program. It is not intended to diagnose MRSA infection nor to guide or monitor treatment for MRSA infections. Performed at [facility], [address], [place], [place] [address]   Urine Culture     Status: None   Collection Time: 04-17-2018 11:45 PM  Result Value Ref Range Status   Specimen Description   Final    URINE, RANDOM Performed at [facility], [address], [place], [place] [address]    Special Requests   Final    NONE Performed at [facility], [address] [place], [place] [address]    Culture   Final    NO GROWTH Performed at [facility], [address] [place], [place] [address]    Report Status 04/08/2018 FINAL  Final  CULTURE, BLOOD (ROUTINE X 2) w Reflex to ID Panel     Status: None   Collection Time: 04/07/18  3:54  AM  Result  Value Ref Range Status   Specimen Description BLOOD LEFT ANTECUBITAL  Final   Special Requests   Final    BOTTLES DRAWN AEROBIC AND ANAEROBIC Blood Culture adequate volume   Culture   Final    NO GROWTH 5 DAYS Performed at [facility], [address] [place], [place] [address]    Report Status 04/13/2018 FINAL  Final  CULTURE, BLOOD (ROUTINE X 2) w Reflex to ID Panel     Status: None   Collection Time: 04/07/18  4:05 AM  Result Value Ref Range Status   Specimen Description BLOOD LEFT WRIST  Final   Special Requests   Final    BOTTLES DRAWN AEROBIC AND ANAEROBIC Blood Culture adequate volume   Culture   Final    NO GROWTH 5 DAYS Performed at [facility], [address], [place], [place] [address]    Report Status 04/15/2018 FINAL  Final  Culture, respiratory (NON-Expectorated)     Status: None   Collection Time: 04/07/18  5:36 AM  Result Value Ref Range Status   Specimen Description   Final    TRACHEAL ASPIRATE Performed at [facility], [address], [place], [place] [address]    Special Requests   Final    NONE Performed at [facility], [address] [place], [place] [address]    Gram Stain   Final    FEW WBC PRESENT, PREDOMINANTLY PMN FEW GRAM POSITIVE COCCI RARE GRAM NEGATIVE RODS    Culture   Final    MODERATE STAPHYLOCOCCUS AUREUS WITHIN NORMAL RESPIRATORY FLORA Performed at [facility], [address], [place], [place] [address]    Report Status 04/10/2018 FINAL  Final   Organism ID, Bacteria STAPHYLOCOCCUS AUREUS  Final      Susceptibility   Staphylococcus aureus - MIC*    CIPROFLOXACIN >=8 RESISTANT Resistant     ERYTHROMYCIN <=0.25 SENSITIVE Sensitive     GENTAMICIN <=0.5 SENSITIVE Sensitive     OXACILLIN <=0.25 SENSITIVE Sensitive     TETRACYCLINE <=1 SENSITIVE Sensitive     VANCOMYCIN <=0.5 SENSITIVE Sensitive     TRIMETH/SULFA <=10 SENSITIVE Sensitive     CLINDAMYCIN <=0.25 SENSITIVE Sensitive      RIFAMPIN <=0.5 SENSITIVE Sensitive     Inducible Clindamycin NEGATIVE Sensitive     * MODERATE STAPHYLOCOCCUS AUREUS    Lab Basic Metabolic Panel: Recent Labs  Lab 04/09/18 0018 04/09/18 0459  04/10/18 0304  04/11/18 0509  04/11/18 1551 04/11/18 2007 04/07/2018 0318 04/30/2018 0419 04/21/2018 0802  NA 142 141  --  141  --  143  --   --   --   --  143  --   K 5.8* 5.6*   < > 5.8*   < > 4.8   < > 4.9 4.8 4.9 4.8 5.0  CL 97* 98*  --  98*  --  101  --   --   --   --  100*  --   CO2 31 29  --  25  --  25  --   --   --   --  25  --   GLUCOSE 123* 136*  --  132*  --  150*  --   --   --   --  119*  --   BUN 71* 75*  --  99*  --  126*  --   --   --   --  140*  --  CREATININE 5.21* 5.24*  --  6.48*  --  7.69*  --   --   --   --  9.27*  --   CALCIUM 6.5* 6.4*  --  6.3*  --  6.4*  --   --   --   --  6.8*  --   MG 2.1 2.0  --  2.1  --  2.3  --   --   --   --  2.3  --   PHOS 6.8* 6.6*  --  6.1*  --  5.7*  --   --   --   --  6.6*  --    < > = values in this interval not displayed.   Liver Function Tests: Recent Labs  Lab 04/08/18 0404 04/09/18 0459 04/10/18 0304 04/11/18 0509 05/03/2018 0419  AST 2,546* 1,595*  1,602* 1,150* 432* 201*  ALT 1,860* 1,675*  1,654* 1,777* 1,172* 928*  ALKPHOS 55 65  66 131* 107 99  BILITOT 1.1 1.4*  1.3* 1.8* 1.0 0.9  PROT 4.5* 4.6*  4.8* 4.9* 4.1* 4.4*  ALBUMIN 2.6* 2.6*  2.7* 2.6* 2.1* 2.1*   No results for input(s): LIPASE, AMYLASE in the last 168 hours. Recent Labs  Lab 04/10/18 0304  AMMONIA 25   CBC: Recent Labs  Lab 04-18-2018 2146  04/08/18 0404 04/09/18 0459 04/10/18 0304 04/11/18 0509 05/05/2018 0419  WBC 15.4*   < > 24.1* 20.7* 15.5* 13.6* 10.3  NEUTROABS 13.6*  --   --  19.6* 14.5* 12.2* 8.1*  HGB 14.5   < > 14.0 13.1 12.6* 11.4* 11.6*  HCT 44.1   < > 41.0 37.8* 36.6* 33.1* 33.9*  MCV 106.7*   < > 102.9* 103.8* 103.7* 104.1* 105.5*  PLT 143*   < > 69* 52* 39* 48* 58*   < > = values in this interval not displayed.   Cardiac  Enzymes: Recent Labs  Lab 04/18/2018 2146 04/07/18 0353 04/07/18 0850 04/07/18 1545 04/08/18 0031  CKTOTAL  --   --  23,390*  --   --   TROPONINI 17.60* >65.00* >65.00* >65.00* >65.00*   Sepsis Labs: Recent Labs  Lab 04/07/18 0038  04/07/18 0353  04/08/18 0404 04/08/18 0944 04/08/18 1210 04/09/18 0459 04/10/18 0304 04/10/18 1419 04/10/18 1726 04/11/18 0509 04/21/2018 0419  PROCALCITON 1.78  --  2.47  --  17.09  --   --   --   --   --   --   --   --   WBC  --   --  21.5*  --  24.1*  --   --  20.7* 15.5*  --   --  13.6* 10.3  LATICACIDVEN  --    < >  --    < >  --  2.9* 2.9*  --   --  1.6 1.3  --   --    < > = values in this interval not displayed.     Theus Espin 05/04/2018, 3:32 PM

## 2018-05-08 DEATH — deceased

## 2018-09-12 IMAGING — MR MR HEAD W/O CM
8 of 10 series · 34 of 48 positions shown · non-contrast
Comparison: Prior CT from 04/09/2018.

CLINICAL DATA: Initial evaluation for cardiac arrest, unresponsive
for 40 minutes. Evaluate for anoxic brain injury.

EXAM:
MRI HEAD WITHOUT CONTRAST
TECHNIQUE: Multiplanar, multiecho pulse sequences of the brain and surrounding
structures were obtained without intravenous contrast.

[Series 2: GRE · sagittal · 5.0mm · 0.45mm/px · 3 of 27 slices shown (1 of 2)]
[im 1/27]
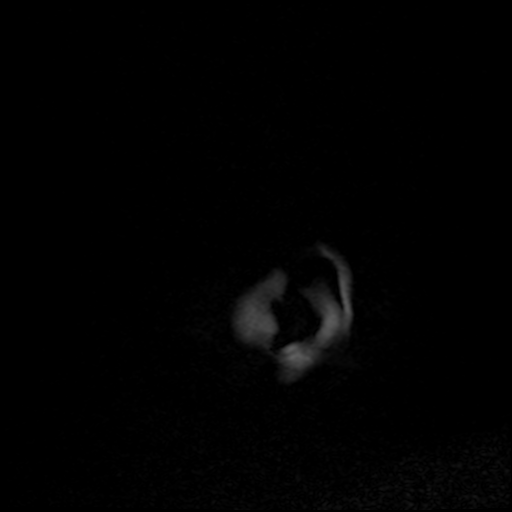
[im 14/27]
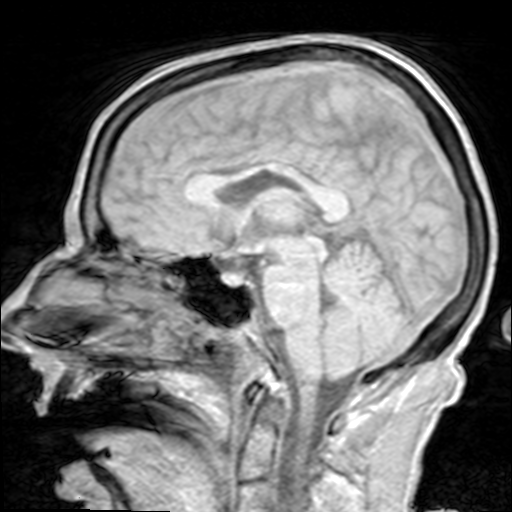
[im 27/27]
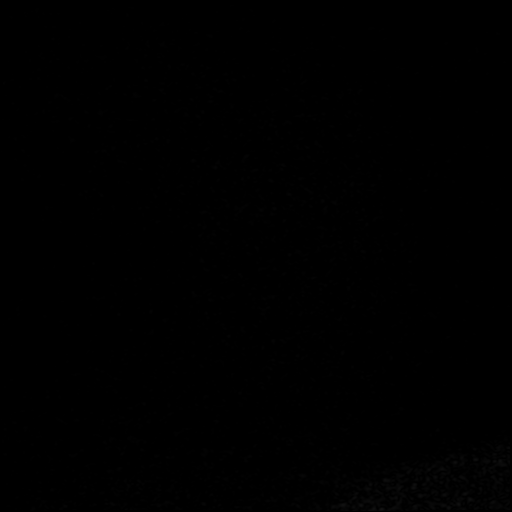

[Series 4: DWI · axial · 3.0mm · 1.80mm/px · z∈[-84,+77]mm · 6 of 49 slices shown (1 of 2)]
[im 1/49]
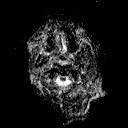
[im 10/49]
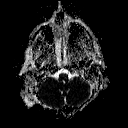
[im 20/49]
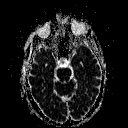
[im 29/49]
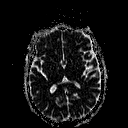
[im 39/49]
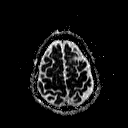
[im 49/49]
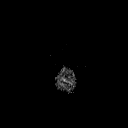

[Series 6: DWI · coronal · 3.0mm · 1.80mm/px · 6 of 49 slices shown (2 of 2)]
[im 1/49]
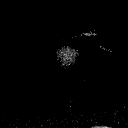
[im 10/49]
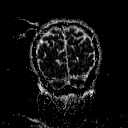
[im 20/49]
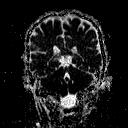
[im 29/49]
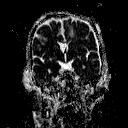
[im 39/49]
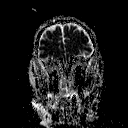
[im 49/49]
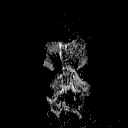

[Series 7: T2 · axial · 5.0mm · 0.45mm/px · z∈[-82,+79]mm · 3 of 26 slices shown (1 of 3)]
[im 1/26]
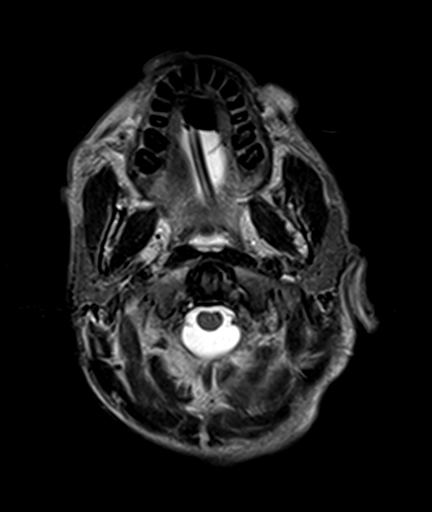
[im 13/26]
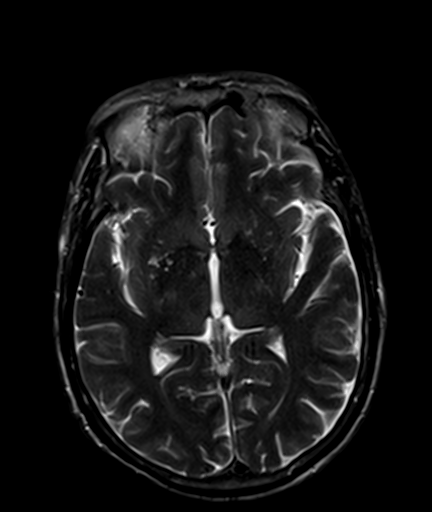
[im 26/26]
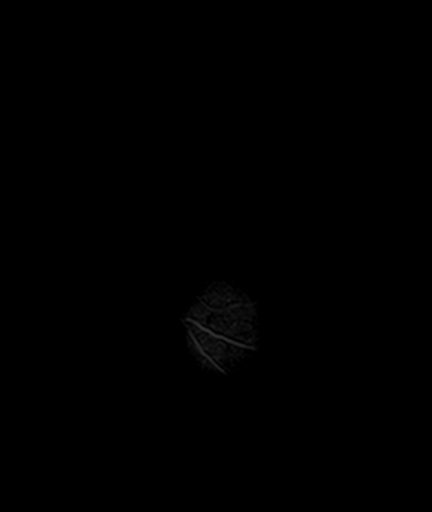

[Series 8: FLAIR · axial · 3.0mm · 0.45mm/px · z∈[-78,+77]mm · 7 of 53 slices shown]
[im 1/53]
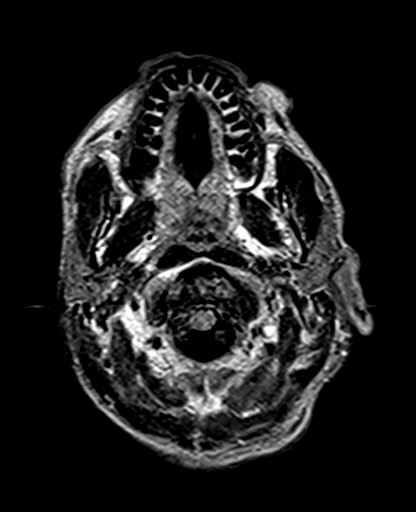
[im 9/53]
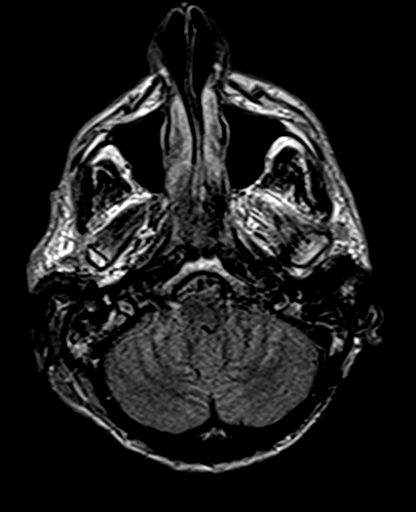
[im 18/53]
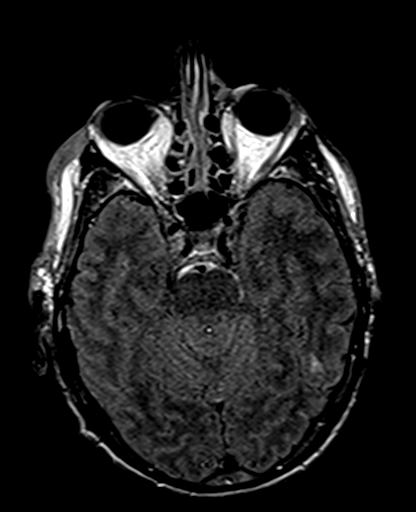
[im 27/53]
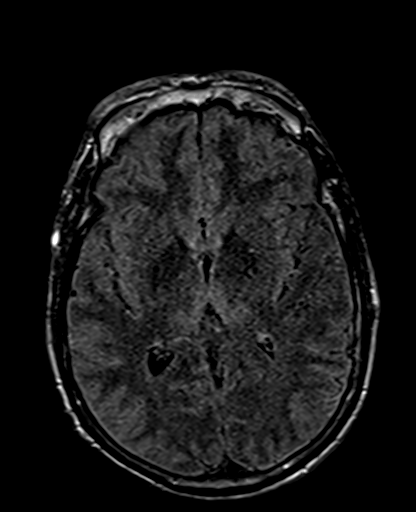
[im 35/53]
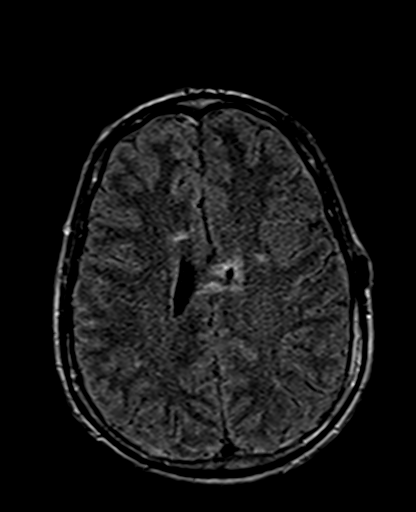
[im 44/53]
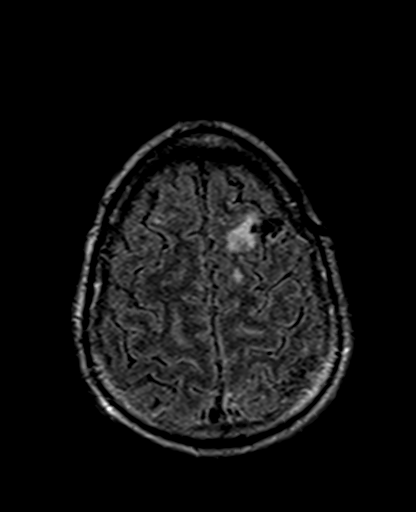
[im 53/53]
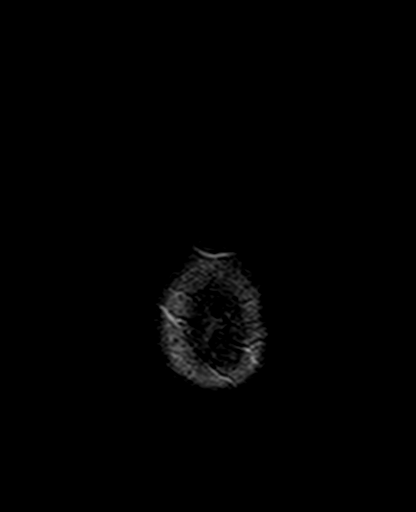

[Series 9: T2 · axial · 5.0mm · 1.20mm/px · z∈[-84,+77]mm · 3 of 26 slices shown (2 of 3)]
[im 1/26]
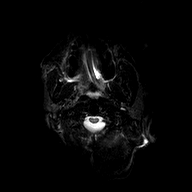
[im 13/26]
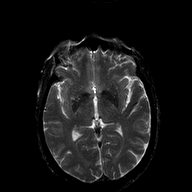
[im 26/26]
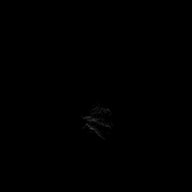

[Series 10: GRE · axial · 5.0mm · 0.45mm/px · z∈[-81,-3]mm · 2 of 25 slices shown (2 of 2)]
[im 1/25]
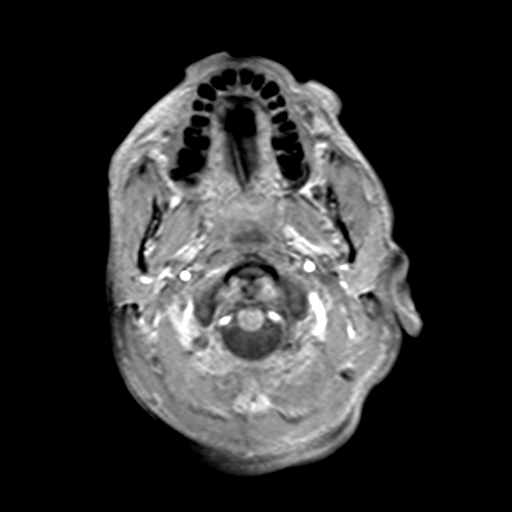
[im 13/25]
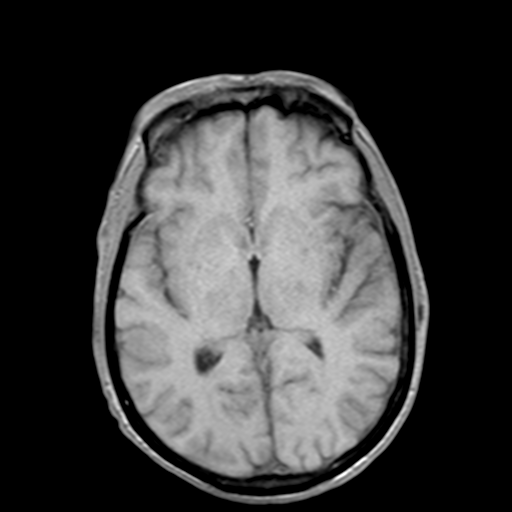

[Series 11: T2 · coronal · 5.0mm · 0.43mm/px · 4 of 29 slices shown (3 of 3)]
[im 1/29]
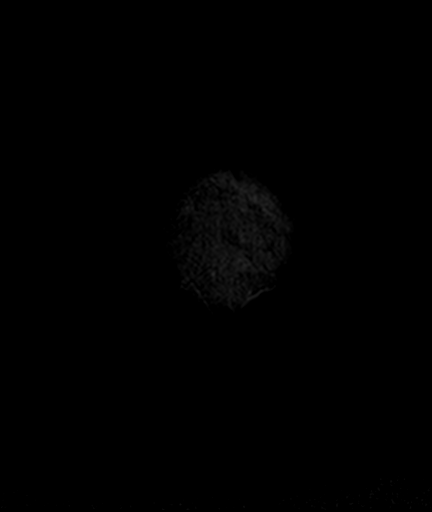
[im 10/29]
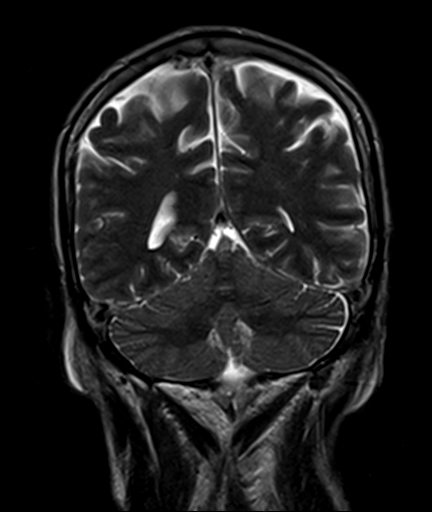
[im 19/29]
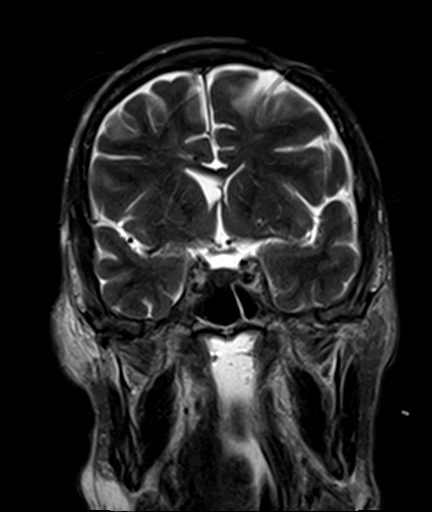
[im 29/29]
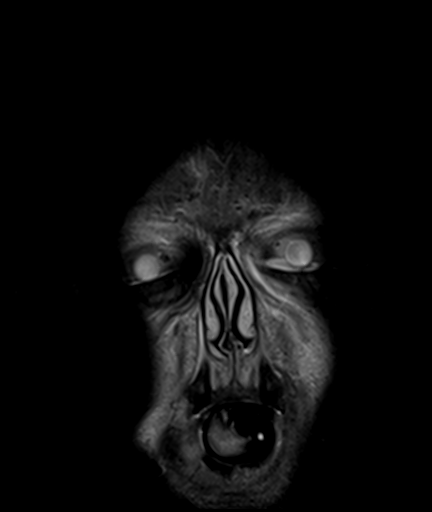

[34 of 48 positions shown; findings below may reference images not displayed]

FINDINGS: Brain: There is subtle mildly hyperintense T2/FLAIR hyperintensity
involving the ventral medial thalami bilaterally, with extension
into the overlying perirolandic cortices. Corresponding mild
diffusion abnormality within these regions (series 100, image 30,
42). Inferior extension into the central midbrain with abnormal
T2/FLAIR signal in diffusion abnormality within the bilateral
cerebellar hemispheres (series 100, image 10). Given provided
history, findings suspicious for Ernan Cova and brain injury. Age
indeterminate susceptibility artifact within the cerebellar vermis
and bilateral cerebellar hemispheres on gradient echo sequence
noted, which could reflect associated petechial hemorrhage (series
9, image 7, 10).

No evidence for acute vascular infarct. Gray-white matter
differentiation otherwise maintained. No other evidence for acute or
chronic intracranial hemorrhage. No mass lesion, mass effect, or
midline shift. Left frontal approach shunt catheter in place with
tip near the posterior septum pellucidum. Left lateral ventricle
asymmetrically decompressed as compared to the right. No
hydrocephalus or ventricular trapping. No extra-axial collections.
Major dural sinuses grossly patent.

Pituitary gland within normal limits. Midline structures intact and
normal.

Vascular: Major intracranial vascular flow voids are maintained.

Skull and upper cervical spine: Craniocervical junction within
normal limits. Upper cervical spine normal. Bone marrow signal
intensity diffusely decreased on T1 weighted imaging, most commonly
related to anemia, smoking, obesity, or other chronic disease.
Reservoir for shunt catheter present at the left frontal scalp.

Sinuses/Orbits: Globes and orbital soft tissues within normal
limits. Scattered mucosal thickening within the paranasal sinuses.
Fluid seen layering within the nasopharynx. Patient is intubated.
Bilateral mastoid effusions noted.

Other: None.
IMPRESSION: 1. Subtle T2/FLAIR and diffusion abnormality involving the bilateral
perirolandic cortices, thalami, midbrain, and bilateral cerebellar
hemispheres, highly suspicious for anoxic brain injury given the
provided history. See above.
2. Left frontal approach shunt catheter in place with stable
ventricular size. No hydrocephalus.
